# Patient Record
Sex: Female | Born: 1985 | Race: White | Hispanic: No | Marital: Married | Smoking: Never smoker
Health system: Southern US, Community
[De-identification: ages and names within clinical notes are randomized; demographics above are authoritative.]

## PROBLEM LIST (undated history)

## (undated) DIAGNOSIS — N390 Urinary tract infection, site not specified: Secondary | ICD-10-CM

## (undated) DIAGNOSIS — Z789 Other specified health status: Secondary | ICD-10-CM

## (undated) DIAGNOSIS — D6851 Activated protein C resistance: Secondary | ICD-10-CM

## (undated) HISTORY — DX: Other specified health status: Z78.9

## (undated) HISTORY — PX: NO PAST SURGERIES: SHX2092

## (undated) HISTORY — DX: Activated protein C resistance: D68.51

---

## 2011-03-06 LAB — OB RESULTS CONSOLE ANTIBODY SCREEN: Antibody Screen: NEGATIVE

## 2011-03-06 LAB — OB RESULTS CONSOLE ABO/RH

## 2011-03-06 LAB — OB RESULTS CONSOLE HEPATITIS B SURFACE ANTIGEN: Hepatitis B Surface Ag: NEGATIVE

## 2011-03-06 LAB — OB RESULTS CONSOLE GC/CHLAMYDIA: Gonorrhea: NEGATIVE

## 2011-03-06 LAB — OB RESULTS CONSOLE RUBELLA ANTIBODY, IGM: Rubella: IMMUNE

## 2011-09-06 ENCOUNTER — Inpatient Hospital Stay (HOSPITAL_COMMUNITY): Admission: AD | Admit: 2011-09-06 | Payer: Self-pay | Source: Ambulatory Visit | Admitting: Obstetrics and Gynecology

## 2011-09-14 LAB — OB RESULTS CONSOLE GBS: GBS: NEGATIVE

## 2011-10-09 ENCOUNTER — Encounter (HOSPITAL_COMMUNITY): Payer: Self-pay

## 2011-10-09 ENCOUNTER — Inpatient Hospital Stay (HOSPITAL_COMMUNITY)
Admission: AD | Admit: 2011-10-09 | Discharge: 2011-10-12 | DRG: 766 | Disposition: A | Discharge status: Home / Self Care | Payer: 59 | Source: Ambulatory Visit | Attending: Obstetrics and Gynecology | Admitting: Obstetrics and Gynecology

## 2011-10-09 ENCOUNTER — Encounter (HOSPITAL_COMMUNITY): Payer: Self-pay | Admitting: Anesthesiology

## 2011-10-09 ENCOUNTER — Encounter (HOSPITAL_COMMUNITY): Payer: Self-pay | Admitting: *Deleted

## 2011-10-09 DIAGNOSIS — O324XX0 Maternal care for high head at term, not applicable or unspecified: Secondary | ICD-10-CM | POA: Diagnosis present

## 2011-10-09 HISTORY — DX: Urinary tract infection, site not specified: N39.0

## 2011-10-09 LAB — CBC
HCT: 37.2 % (ref 36.0–46.0)
Hemoglobin: 12.5 g/dL (ref 12.0–15.0)
MCHC: 33.6 g/dL (ref 30.0–36.0)
MCV: 93.9 fL (ref 78.0–100.0)
RDW: 13.6 % (ref 11.5–15.5)

## 2011-10-09 MED ORDER — LIDOCAINE HCL (PF) 1 % IJ SOLN
INTRAMUSCULAR | Status: DC | PRN
  Administered 2011-10-09 (×2): 4 mL

## 2011-10-09 MED ORDER — OXYTOCIN 20 UNITS IN LACTATED RINGERS INFUSION - SIMPLE: 125.0000 mL/h | Freq: Once | INTRAVENOUS | Status: DC

## 2011-10-09 MED ORDER — PHENYLEPHRINE 40 MCG/ML (10ML) SYRINGE FOR IV PUSH (FOR BLOOD PRESSURE SUPPORT): 80.0000 ug | PREFILLED_SYRINGE | INTRAVENOUS | Status: DC | PRN

## 2011-10-09 MED ORDER — FLEET ENEMA 7-19 GM/118ML RE ENEM: 1.0000 | ENEMA | RECTAL | Status: DC | PRN

## 2011-10-09 MED ORDER — EPHEDRINE 5 MG/ML INJ
10.0000 mg | INTRAVENOUS | Status: DC | PRN
  Filled 2011-10-09: qty 4

## 2011-10-09 MED ORDER — EPHEDRINE 5 MG/ML INJ: 10.0000 mg | INTRAVENOUS | Status: DC | PRN

## 2011-10-09 MED ORDER — ACETAMINOPHEN 325 MG PO TABS: 650.0000 mg | ORAL_TABLET | ORAL | Status: DC | PRN

## 2011-10-09 MED ORDER — LACTATED RINGERS IV SOLN: 500.0000 mL | Freq: Once | INTRAVENOUS | Status: DC

## 2011-10-09 MED ORDER — DIPHENHYDRAMINE HCL 50 MG/ML IJ SOLN: 12.5000 mg | INTRAMUSCULAR | Status: DC | PRN

## 2011-10-09 MED ORDER — LACTATED RINGERS IV SOLN
INTRAVENOUS | Status: DC
  Administered 2011-10-09 – 2011-10-10 (×3): via INTRAVENOUS

## 2011-10-09 MED ORDER — OXYTOCIN BOLUS FROM INFUSION
500.0000 mL | Freq: Once | INTRAVENOUS | Status: DC
  Filled 2011-10-09: qty 1000
  Filled 2011-10-09: qty 500

## 2011-10-09 MED ORDER — FENTANYL 2.5 MCG/ML BUPIVACAINE 1/10 % EPIDURAL INFUSION (WH - ANES)
INTRAMUSCULAR | Status: DC | PRN
  Administered 2011-10-09: 14 mL/h via EPIDURAL

## 2011-10-09 MED ORDER — LACTATED RINGERS IV SOLN
500.0000 mL | INTRAVENOUS | Status: DC | PRN
  Administered 2011-10-09: 1000 mL via INTRAVENOUS

## 2011-10-09 MED ORDER — ONDANSETRON HCL 4 MG/2ML IJ SOLN: 4.0000 mg | Freq: Four times a day (QID) | INTRAMUSCULAR | Status: DC | PRN

## 2011-10-09 MED ORDER — LIDOCAINE HCL (PF) 1 % IJ SOLN
30.0000 mL | INTRAMUSCULAR | Status: DC | PRN
  Filled 2011-10-09: qty 30

## 2011-10-09 MED ORDER — CITRIC ACID-SODIUM CITRATE 334-500 MG/5ML PO SOLN
30.0000 mL | ORAL | Status: DC | PRN
  Administered 2011-10-10: 30 mL via ORAL
  Filled 2011-10-09: qty 15

## 2011-10-09 MED ORDER — IBUPROFEN 600 MG PO TABS: 600.0000 mg | ORAL_TABLET | Freq: Four times a day (QID) | ORAL | Status: DC | PRN

## 2011-10-09 MED ORDER — OXYCODONE-ACETAMINOPHEN 5-325 MG PO TABS: 1.0000 | ORAL_TABLET | ORAL | Status: DC | PRN

## 2011-10-09 MED ORDER — FENTANYL 2.5 MCG/ML BUPIVACAINE 1/10 % EPIDURAL INFUSION (WH - ANES)
14.0000 mL/h | INTRAMUSCULAR | Status: DC
  Administered 2011-10-09: 14 mL/h via EPIDURAL
  Filled 2011-10-09 (×3): qty 60

## 2011-10-09 MED ORDER — PHENYLEPHRINE 40 MCG/ML (10ML) SYRINGE FOR IV PUSH (FOR BLOOD PRESSURE SUPPORT)
80.0000 ug | PREFILLED_SYRINGE | INTRAVENOUS | Status: DC | PRN
  Filled 2011-10-09: qty 5

## 2011-10-09 NOTE — MAU Note (Signed)
Contractions started yesterday have gotten closer and stronger.  Lost mucous plug yesterday morning.

## 2011-10-09 NOTE — Anesthesia Procedure Notes (Signed)
Epidural Patient location during procedure: OB Start time: 10/09/2011 4:00 PM  Staffing Anesthesiologist: Davarion Cuffee A. Performed by: anesthesiologist   Preanesthetic Checklist Completed: patient identified, site marked, surgical consent, pre-op evaluation, timeout performed, IV checked, risks and benefits discussed and monitors and equipment checked  Epidural Patient position: sitting Prep: site prepped and draped and DuraPrep Patient monitoring: continuous pulse ox and blood pressure Approach: midline Injection technique: LOR air  Needle:  Needle type: Tuohy  Needle gauge: 17 G Needle length: 9 cm Needle insertion depth: 5 cm cm Catheter type: closed end flexible Catheter size: 19 Gauge Catheter at skin depth: 10 cm Test dose: negative and Other  Assessment Events: blood not aspirated, injection not painful, no injection resistance, negative IV test and no paresthesia  Additional Notes Patient identified. Risks and benefits discussed including failed block, incomplete  Pain control, post dural puncture headache, nerve damage, paralysis, blood pressure Changes, nausea, vomiting, reactions to medications-both toxic and allergic and post Partum back pain. All questions were answered. Patient expressed understanding and wished to proceed. Sterile technique was used throughout procedure. Epidural site was Dressed with sterile barrier dressing. No paresthesias, signs of intravascular injection Or signs of intrathecal spread were encountered.  Patient was more comfortable after the epidural was dosed. Please see RN's note for documentation of vital signs and FHR which are stable.

## 2011-10-09 NOTE — MAU Note (Signed)
Patient states she has been having contractions every 5-7. Denies any bleeding or leaking. Reports good fetal movement.

## 2011-10-09 NOTE — H&P (Signed)
Misty Carr is a 26 y.o. female presenting for labor . Maternal Medical History:  Reason for admission: Reason for admission: contractions.  Contractions: Onset was 3-5 hours ago.   Frequency: regular.   Perceived severity is moderate.    Fetal activity: Perceived fetal activity is normal.      OB History    Grav Para Term Preterm Abortions TAB SAB Ect Mult Living   1              Past Medical History  Diagnosis Date  . Urinary tract infection    Past Surgical History  Procedure Date  . No past surgeries    Family History: family history is negative for Anesthesia problems. Social History:  reports that she has never smoked. She has never used smokeless tobacco. She reports that she does not drink alcohol or use illicit drugs.  ROS  Dilation: 5 Effacement (%): 90 Station: -2 Exam by:: jolynn Blood pressure 135/74, pulse 97, temperature 98.7 F (37.1 C), temperature source Oral, resp. rate 18, height 5' 4.5" (1.638 m), weight 196 lb 9.6 oz (89.177 kg), SpO2 100.00%. Maternal Exam:  Uterine Assessment: Contraction strength is moderate.  Contraction frequency is regular.   Abdomen: Patient reports no abdominal tenderness. Fundal height is term FH.   Estimated fetal weight is AGA.   Fetal presentation: vertex  Introitus: Normal vulva. Normal vagina.  Pelvis: adequate for delivery.   Cervix: Cervix evaluated by digital exam.     Physical Exam  Prenatal labs: ABO, Rh:   Antibody:   Rubella:   RPR:    HBsAg:    HIV:    GBS:     Assessment/Plan: Term IUP, labor   [name] M 10/09/2011, 1:44 PM

## 2011-10-09 NOTE — Anesthesia Preprocedure Evaluation (Signed)

## 2011-10-10 ENCOUNTER — Encounter (HOSPITAL_COMMUNITY): Payer: Self-pay | Admitting: Anesthesiology

## 2011-10-10 ENCOUNTER — Inpatient Hospital Stay (HOSPITAL_COMMUNITY): Payer: 59 | Admitting: Anesthesiology

## 2011-10-10 ENCOUNTER — Encounter (HOSPITAL_COMMUNITY): Payer: Self-pay | Admitting: *Deleted

## 2011-10-10 ENCOUNTER — Encounter (HOSPITAL_COMMUNITY)
Admission: AD | Disposition: A | Discharge status: Home / Self Care | Payer: Self-pay | Source: Ambulatory Visit | Attending: Obstetrics and Gynecology

## 2011-10-10 SURGERY — Surgical Case
Anesthesia: Regional

## 2011-10-10 MED ORDER — KETOROLAC TROMETHAMINE 30 MG/ML IJ SOLN
INTRAMUSCULAR | Status: AC
  Filled 2011-10-10: qty 1

## 2011-10-10 MED ORDER — SODIUM CHLORIDE 0.9 % IJ SOLN: 3.0000 mL | INTRAMUSCULAR | Status: DC | PRN

## 2011-10-10 MED ORDER — MEPERIDINE HCL 25 MG/ML IJ SOLN
INTRAMUSCULAR | Status: AC
  Filled 2011-10-10: qty 1

## 2011-10-10 MED ORDER — OXYTOCIN 20 UNITS IN LACTATED RINGERS INFUSION - SIMPLE
1.0000 m[IU]/min | INTRAVENOUS | Status: DC
  Administered 2011-10-10: 1 m[IU]/min via INTRAVENOUS

## 2011-10-10 MED ORDER — KETOROLAC TROMETHAMINE 30 MG/ML IJ SOLN: 30.0000 mg | Freq: Four times a day (QID) | INTRAMUSCULAR | Status: AC | PRN

## 2011-10-10 MED ORDER — ACETAMINOPHEN 325 MG PO TABS: 325.0000 mg | ORAL_TABLET | ORAL | Status: DC | PRN

## 2011-10-10 MED ORDER — MENTHOL 3 MG MT LOZG: 1.0000 | LOZENGE | OROMUCOSAL | Status: DC | PRN

## 2011-10-10 MED ORDER — LACTATED RINGERS IV SOLN
INTRAVENOUS | Status: DC | PRN
  Administered 2011-10-10 (×2): via INTRAVENOUS

## 2011-10-10 MED ORDER — NALBUPHINE HCL 10 MG/ML IJ SOLN
5.0000 mg | INTRAMUSCULAR | Status: DC | PRN
Start: 2011-10-10 — End: 2011-10-12
  Filled 2011-10-10: qty 1

## 2011-10-10 MED ORDER — ONDANSETRON HCL 4 MG/2ML IJ SOLN
INTRAMUSCULAR | Status: DC | PRN
  Administered 2011-10-10: 4 mg via INTRAVENOUS

## 2011-10-10 MED ORDER — SODIUM CHLORIDE 0.9 % IJ SOLN: 3.0000 mL | Freq: Two times a day (BID) | INTRAMUSCULAR | Status: DC

## 2011-10-10 MED ORDER — MEPERIDINE HCL 25 MG/ML IJ SOLN
INTRAMUSCULAR | Status: DC | PRN
  Administered 2011-10-10: 12.5 mg via INTRAVENOUS

## 2011-10-10 MED ORDER — MIDAZOLAM HCL 2 MG/2ML IJ SOLN: 0.5000 mg | Freq: Once | INTRAMUSCULAR | Status: DC | PRN

## 2011-10-10 MED ORDER — DIBUCAINE 1 % RE OINT: 1.0000 "application " | TOPICAL_OINTMENT | RECTAL | Status: DC | PRN

## 2011-10-10 MED ORDER — SODIUM CHLORIDE 0.9 % IV SOLN
250.0000 mL | INTRAVENOUS | Status: DC
  Administered 2011-10-10: 250 mL via INTRAVENOUS

## 2011-10-10 MED ORDER — ZOLPIDEM TARTRATE 5 MG PO TABS: 5.0000 mg | ORAL_TABLET | Freq: Every evening | ORAL | Status: DC | PRN

## 2011-10-10 MED ORDER — DIPHENHYDRAMINE HCL 50 MG/ML IJ SOLN: 12.5000 mg | INTRAMUSCULAR | Status: DC | PRN

## 2011-10-10 MED ORDER — PROMETHAZINE HCL 25 MG/ML IJ SOLN: 6.2500 mg | INTRAMUSCULAR | Status: DC | PRN

## 2011-10-10 MED ORDER — KETOROLAC TROMETHAMINE 30 MG/ML IJ SOLN
30.0000 mg | Freq: Four times a day (QID) | INTRAMUSCULAR | Status: AC | PRN
  Administered 2011-10-10 (×2): 30 mg via INTRAVENOUS
  Filled 2011-10-10: qty 1

## 2011-10-10 MED ORDER — TETANUS-DIPHTH-ACELL PERTUSSIS 5-2.5-18.5 LF-MCG/0.5 IM SUSP: 0.5000 mL | Freq: Once | INTRAMUSCULAR | Status: DC

## 2011-10-10 MED ORDER — SIMETHICONE 80 MG PO CHEW
80.0000 mg | CHEWABLE_TABLET | Freq: Three times a day (TID) | ORAL | Status: DC
  Administered 2011-10-10 – 2011-10-12 (×8): 80 mg via ORAL

## 2011-10-10 MED ORDER — SODIUM BICARBONATE 8.4 % IV SOLN
INTRAVENOUS | Status: DC | PRN
  Administered 2011-10-10: 5 mL via EPIDURAL

## 2011-10-10 MED ORDER — LANOLIN HYDROUS EX OINT: 1.0000 "application " | TOPICAL_OINTMENT | CUTANEOUS | Status: DC | PRN

## 2011-10-10 MED ORDER — BISACODYL 10 MG RE SUPP: 10.0000 mg | Freq: Every day | RECTAL | Status: DC | PRN

## 2011-10-10 MED ORDER — ONDANSETRON HCL 4 MG/2ML IJ SOLN: 4.0000 mg | Freq: Three times a day (TID) | INTRAMUSCULAR | Status: DC | PRN

## 2011-10-10 MED ORDER — MEPERIDINE HCL 25 MG/ML IJ SOLN
6.2500 mg | INTRAMUSCULAR | Status: DC | PRN
  Administered 2011-10-10: 6.25 mg via INTRAVENOUS

## 2011-10-10 MED ORDER — METOCLOPRAMIDE HCL 5 MG/ML IJ SOLN: 10.0000 mg | Freq: Three times a day (TID) | INTRAMUSCULAR | Status: DC | PRN

## 2011-10-10 MED ORDER — FLEET ENEMA 7-19 GM/118ML RE ENEM: 1.0000 | ENEMA | Freq: Every day | RECTAL | Status: DC | PRN

## 2011-10-10 MED ORDER — ONDANSETRON HCL 4 MG PO TABS: 4.0000 mg | ORAL_TABLET | ORAL | Status: DC | PRN

## 2011-10-10 MED ORDER — DIPHENHYDRAMINE HCL 25 MG PO CAPS: 25.0000 mg | ORAL_CAPSULE | ORAL | Status: DC | PRN

## 2011-10-10 MED ORDER — SENNOSIDES-DOCUSATE SODIUM 8.6-50 MG PO TABS
2.0000 | ORAL_TABLET | Freq: Every day | ORAL | Status: DC
  Administered 2011-10-11: 2 via ORAL

## 2011-10-10 MED ORDER — SCOPOLAMINE 1 MG/3DAYS TD PT72
MEDICATED_PATCH | TRANSDERMAL | Status: AC
  Filled 2011-10-10: qty 1

## 2011-10-10 MED ORDER — MORPHINE SULFATE (PF) 0.5 MG/ML IJ SOLN
INTRAMUSCULAR | Status: DC | PRN
Start: 2011-10-10 — End: 2011-10-10
  Administered 2011-10-10: 4 mg via EPIDURAL

## 2011-10-10 MED ORDER — FENTANYL CITRATE 0.05 MG/ML IJ SOLN: 25.0000 ug | INTRAMUSCULAR | Status: DC | PRN

## 2011-10-10 MED ORDER — PRENATAL MULTIVITAMIN CH
1.0000 | ORAL_TABLET | Freq: Every day | ORAL | Status: DC
  Administered 2011-10-11 – 2011-10-12 (×2): 1 via ORAL
  Filled 2011-10-10 (×3): qty 1

## 2011-10-10 MED ORDER — TERBUTALINE SULFATE 1 MG/ML IJ SOLN: 0.2500 mg | Freq: Once | INTRAMUSCULAR | Status: DC | PRN

## 2011-10-10 MED ORDER — NALBUPHINE HCL 10 MG/ML IJ SOLN
5.0000 mg | INTRAMUSCULAR | Status: DC | PRN
  Filled 2011-10-10: qty 1

## 2011-10-10 MED ORDER — MEPERIDINE HCL 25 MG/ML IJ SOLN: 6.2500 mg | INTRAMUSCULAR | Status: DC | PRN

## 2011-10-10 MED ORDER — OXYTOCIN 20 UNITS IN LACTATED RINGERS INFUSION - SIMPLE: 125.0000 mL/h | INTRAVENOUS | Status: AC

## 2011-10-10 MED ORDER — PHENYLEPHRINE HCL 10 MG/ML IJ SOLN
INTRAMUSCULAR | Status: DC | PRN
  Administered 2011-10-10 (×4): 120 ug via INTRAVENOUS

## 2011-10-10 MED ORDER — OXYCODONE-ACETAMINOPHEN 5-325 MG PO TABS
1.0000 | ORAL_TABLET | Freq: Four times a day (QID) | ORAL | Status: DC | PRN
  Administered 2011-10-11: 1 via ORAL
  Filled 2011-10-10: qty 1

## 2011-10-10 MED ORDER — OXYTOCIN 10 UNIT/ML IJ SOLN
20.0000 [IU] | INTRAVENOUS | Status: DC | PRN
  Administered 2011-10-10: 20 [IU] via INTRAVENOUS

## 2011-10-10 MED ORDER — DIPHENHYDRAMINE HCL 50 MG/ML IJ SOLN: 25.0000 mg | INTRAMUSCULAR | Status: DC | PRN

## 2011-10-10 MED ORDER — MORPHINE SULFATE 0.5 MG/ML IJ SOLN
INTRAMUSCULAR | Status: AC
  Filled 2011-10-10: qty 10

## 2011-10-10 MED ORDER — SODIUM CHLORIDE 0.9 % IV SOLN
1.0000 ug/kg/h | INTRAVENOUS | Status: DC | PRN
  Filled 2011-10-10: qty 2.5

## 2011-10-10 MED ORDER — SIMETHICONE 80 MG PO CHEW: 80.0000 mg | CHEWABLE_TABLET | ORAL | Status: DC | PRN

## 2011-10-10 MED ORDER — MEASLES, MUMPS & RUBELLA VAC ~~LOC~~ INJ
0.5000 mL | INJECTION | Freq: Once | SUBCUTANEOUS | Status: DC
  Filled 2011-10-10: qty 0.5

## 2011-10-10 MED ORDER — ACETAMINOPHEN 10 MG/ML IV SOLN
1000.0000 mg | Freq: Four times a day (QID) | INTRAVENOUS | Status: AC | PRN
  Filled 2011-10-10: qty 100

## 2011-10-10 MED ORDER — CEFAZOLIN SODIUM 1-5 GM-% IV SOLN
1.0000 g | Freq: Once | INTRAVENOUS | Status: AC
  Administered 2011-10-10: 1 g via INTRAVENOUS
  Filled 2011-10-10: qty 50

## 2011-10-10 MED ORDER — DIPHENHYDRAMINE HCL 25 MG PO CAPS: 25.0000 mg | ORAL_CAPSULE | Freq: Four times a day (QID) | ORAL | Status: DC | PRN

## 2011-10-10 MED ORDER — IBUPROFEN 800 MG PO TABS
800.0000 mg | ORAL_TABLET | Freq: Three times a day (TID) | ORAL | Status: DC | PRN
  Administered 2011-10-10 – 2011-10-12 (×4): 800 mg via ORAL
  Filled 2011-10-10 (×5): qty 1

## 2011-10-10 MED ORDER — WITCH HAZEL-GLYCERIN EX PADS: 1.0000 "application " | MEDICATED_PAD | CUTANEOUS | Status: DC | PRN

## 2011-10-10 MED ORDER — SCOPOLAMINE 1 MG/3DAYS TD PT72
1.0000 | MEDICATED_PATCH | Freq: Once | TRANSDERMAL | Status: DC
  Administered 2011-10-10: 1.5 mg via TRANSDERMAL

## 2011-10-10 MED ORDER — ONDANSETRON HCL 4 MG/2ML IJ SOLN: 4.0000 mg | INTRAMUSCULAR | Status: DC | PRN

## 2011-10-10 MED ORDER — NALOXONE HCL 0.4 MG/ML IJ SOLN: 0.4000 mg | INTRAMUSCULAR | Status: DC | PRN

## 2011-10-10 SURGICAL SUPPLY — 23 items
CLOTH BEACON ORANGE TIMEOUT ST (SAFETY) ×2 IMPLANT
DRESSING TELFA 8X3 (GAUZE/BANDAGES/DRESSINGS) IMPLANT
ELECT REM PT RETURN 9FT ADLT (ELECTROSURGICAL) ×2
ELECTRODE REM PT RTRN 9FT ADLT (ELECTROSURGICAL) ×1 IMPLANT
EXTRACTOR VACUUM M CUP 4 TUBE (SUCTIONS) IMPLANT
GAUZE SPONGE 4X4 12PLY STRL LF (GAUZE/BANDAGES/DRESSINGS) ×4 IMPLANT
GLOVE BIO SURGEON STRL SZ7 (GLOVE) ×4 IMPLANT
GOWN PREVENTION PLUS LG XLONG (DISPOSABLE) ×6 IMPLANT
KIT ABG SYR 3ML LUER SLIP (SYRINGE) IMPLANT
NEEDLE HYPO 25X5/8 SAFETYGLIDE (NEEDLE) ×2 IMPLANT
NS IRRIG 1000ML POUR BTL (IV SOLUTION) ×2 IMPLANT
PACK C SECTION WH (CUSTOM PROCEDURE TRAY) ×2 IMPLANT
PAD ABD 7.5X8 STRL (GAUZE/BANDAGES/DRESSINGS) IMPLANT
SLEEVE SCD COMPRESS KNEE MED (MISCELLANEOUS) IMPLANT
STRIP CLOSURE SKIN 1/2X4 (GAUZE/BANDAGES/DRESSINGS) ×2 IMPLANT
SUT CHROMIC 0 CTX 36 (SUTURE) ×6 IMPLANT
SUT MON AB 4-0 PS1 27 (SUTURE) ×2 IMPLANT
SUT PDS AB 0 CT1 27 (SUTURE) ×4 IMPLANT
SUT VIC AB 3-0 CT1 27 (SUTURE) ×2
SUT VIC AB 3-0 CT1 TAPERPNT 27 (SUTURE) ×2 IMPLANT
TOWEL OR 17X24 6PK STRL BLUE (TOWEL DISPOSABLE) ×4 IMPLANT
TRAY FOLEY CATH 14FR (SET/KITS/TRAYS/PACK) ×2 IMPLANT
WATER STERILE IRR 1000ML POUR (IV SOLUTION) ×2 IMPLANT

## 2011-10-10 NOTE — Progress Notes (Signed)
Subjective: Postpartum Day 0  Cesarean Delivery Patient reports incisional pain and tolerating PO.    Objective: Vital signs in last 24 hours: Temp:  [97.6 F (36.4 C)-98.8 F (37.1 C)] 98 F (36.7 C) (06/04 0810) Pulse Rate:  [70-104] 80  (06/04 0810) Resp:  [16-27] 20  (06/04 0810) BP: (72-135)/(43-81) 96/55 mmHg (06/04 0810) SpO2:  [93 %-100 %] 97 % (06/04 0810) Weight:  [89.177 kg (196 lb 9.6 oz)] 89.177 kg (196 lb 9.6 oz) (06/03 1202)  Physical Exam:  General: alert, cooperative and appears stated age Lochia: appropriate Uterine Fundus: firm Incision: healing well DVT Evaluation: No evidence of DVT seen on physical exam.   Basename 10/09/11 1345  HGB 12.5  HCT 37.2    Assessment/Plan: Status post Cesarean section. Doing well postoperatively.  Continue current care.  Loreli Debruler L 10/10/2011, 8:25 AM

## 2011-10-10 NOTE — Progress Notes (Signed)
Pt C/C pushing over 2 hrs , now straight OA, ctx only q 3-5 mi with reactive FHR She still making good pushing effort Will aug with pitocin and follow

## 2011-10-10 NOTE — Addendum Note (Signed)
Addendum  created 10/10/11 1552 by Algis Greenhouse, CRNA   Modules edited:Notes Section

## 2011-10-10 NOTE — Transfer of Care (Signed)
Immediate Anesthesia Transfer of Care Note  Patient: Misty Carr  Procedure(s) Performed: Procedure(s) (LRB): CESAREAN SECTION (N/A)  Patient Location: PACU  Anesthesia Type: Epidural  Level of Consciousness: awake, alert  and oriented  Airway & Oxygen Therapy: Patient Spontanous Breathing  Post-op Assessment: Report given to PACU RN and Post -op Vital signs reviewed and stable  Post vital signs: Reviewed and stable  Complications: No apparent anesthesia complications

## 2011-10-10 NOTE — Anesthesia Postprocedure Evaluation (Signed)
  Anesthesia Post Note  Patient: Misty Carr  Procedure(s) Performed: Procedure(s) (LRB): CESAREAN SECTION (N/A)  Anesthesia type: Epidural  Patient location: Mother/Baby  Post pain: Pain level controlled  Post assessment: Post-op Vital signs reviewed  Last Vitals:  Filed Vitals:   10/10/11 1330  BP: 103/67  Pulse: 98  Temp: 36.5 C  Resp: 20    Post vital signs: Reviewed  Level of consciousness:alert  Complications: No apparent anesthesia complications

## 2011-10-10 NOTE — Op Note (Signed)
Preoperative diagnosis: Failure to descend  Postoperative diagnosis: Same  Procedure: Primary low transverse cesarean section  Anesthesia: Epidural  Surgeon: Marcelle Overlie  EBL: None 900 cc  Specimens removed: Placenta to labor and delivery  Procedure and findings:  The patient was taken to the operating room after an adequate level of epidural anesthesia was obtained with the patient supine position the abdomen prepped and draped in the usual fashion for cesarean section. Foley catheter was already M. draining clear urine appropriate timeout was taken at that point.  Transverse Pfannenstiel incision was made 2 finger restaurant symphysis carried down to the fascia which was incised and extended transversely. Rectus muscle divided in the midline peritoneum entered superiorly without incident and extended in a vertical fashion. The vesicouterine serosa was incised and the bladder was bluntly and sharply dissected below, bladder blade repositioned. Transverse incision made in the lower segment extended with blunt dissection the patient and then delivered of a healthy female Apgars 9 and 10 the infant was suctioned cord clamped and passed the pediatric team for further care. Placenta was then delivered manually intact and sent to labor and delivery. Uterus exteriorized cavity wiped clean with a laparotomy pack closure transversely of 0 chromic in a locked fashion followed by an imbricating layer of 0 chromic bilateral tubes and ovaries were normal careful inspection the bladder flap area it revealed it to be hemostatic and intact. Prior to closure sponge denies precast reported as correct x2 peritoneum was closed with a running 2-0 Vicryl suture. 2-0 Vicryl running suture used to reapproximate the rectus muscles in the midline. A 0 PDS suture was then used to close the fascia from laterally to midline on either side subcutaneous tissue was irrigated noted to be minimal to moderate and hemostatic 4-0 Monocryl  subcuticular closure. Clear urine noted at the end of the case she did receive Ancef 1 g IV preop and Pitocin IV after the cord was clamped mother and baby doing well that point.  Dictated with [facility]  Barret Esquivel M. Milana Obey.D.

## 2011-10-10 NOTE — Anesthesia Postprocedure Evaluation (Signed)
Anesthesia Post Note  Patient: Misty Carr  Procedure(s) Performed: Procedure(s) (LRB): CESAREAN SECTION (N/A)  Anesthesia type: Epidural  Patient location: PACU  Post pain: Pain level controlled  Post assessment: Post-op Vital signs reviewed  Last Vitals:  Filed Vitals:   10/10/11 0345  BP: 84/50  Pulse: 94  Temp:   Resp: 23    Post vital signs: Reviewed  Level of consciousness: awake  Complications: No apparent anesthesia complications

## 2011-10-10 NOTE — Progress Notes (Signed)
Stable +reactive FHR but no further descent, rec CS for failure to descend, proced + risks discussed

## 2011-10-11 ENCOUNTER — Encounter (HOSPITAL_COMMUNITY): Payer: Self-pay | Admitting: Obstetrics and Gynecology

## 2011-10-11 LAB — CBC
HCT: 23.7 % — ABNORMAL LOW (ref 36.0–46.0)
Hemoglobin: 7.8 g/dL — ABNORMAL LOW (ref 12.0–15.0)
MCV: 94 fL (ref 78.0–100.0)
Platelets: 134 10*3/uL — ABNORMAL LOW (ref 150–400)
RBC: 2.52 MIL/uL — ABNORMAL LOW (ref 3.87–5.11)
WBC: 14.7 10*3/uL — ABNORMAL HIGH (ref 4.0–10.5)

## 2011-10-11 NOTE — Anesthesia Postprocedure Evaluation (Signed)
  Anesthesia Post-op Note  Patient: Misty Carr  Procedure(s) Performed: Procedure(s) (LRB): CESAREAN SECTION (N/A)  Patient Location: Mother/Baby  Anesthesia Type: Epidural  Level of Consciousness: awake, alert  and oriented  Airway and Oxygen Therapy: Patient Spontanous Breathing  Post-op Pain: mild  Post-op Assessment: Patient's Cardiovascular Status Stable and Respiratory Function Stable  Post-op Vital Signs: stable  Complications: No apparent anesthesia complications

## 2011-10-11 NOTE — Progress Notes (Signed)
Subjective: Postpartum Day 1: Cesarean Delivery Patient reports tolerating PO and no problems voiding.    Objective: Vital signs in last 24 hours: Temp:  [97.4 F (36.3 C)-98.4 F (36.9 C)] 97.9 F (36.6 C) (06/05 0547) Pulse Rate:  [76-103] 76  (06/05 0547) Resp:  [18-22] 18  (06/05 0547) BP: (91-103)/(51-67) 94/60 mmHg (06/05 0547) SpO2:  [97 %-98 %] 98 % (06/04 2345)  Physical Exam:  General: alert and cooperative Lochia: appropriate Uterine Fundus: firm Incision: healing well, no significant drainage DVT Evaluation: No evidence of DVT seen on physical exam.   Basename 10/11/11 0520 10/09/11 1345  HGB 7.8* 12.5  HCT 23.7* 37.2    Assessment/Plan: Status post Cesarean section. Doing well postoperatively.  Continue current care.  Misty Carr 10/11/2011, 9:20 AM

## 2011-10-11 NOTE — Addendum Note (Signed)
Addendum  created 10/11/11 1119 by Earmon Phoenix, CRNA   Modules edited:Charges VN, Notes Section

## 2011-10-11 NOTE — Plan of Care (Signed)
Problem: Discharge Progression Outcomes Goal: Remove staples per MD order Outcome: Not Applicable Date Met:  10/11/11 Internal sutures on incision

## 2011-10-12 MED ORDER — OXYCODONE-ACETAMINOPHEN 5-500 MG PO CAPS: 1.0000 | ORAL_CAPSULE | ORAL | Status: AC | PRN

## 2011-10-12 NOTE — Progress Notes (Signed)
Subjective: Postpartum Day two: Cesarean Delivery Patient reports tolerating PO, + flatus and no problems voiding.    Objective: Vital signs in last 24 hours: Temp:  [97.8 F (36.6 C)-98.4 F (36.9 C)] 98.4 F (36.9 C) (06/06 0522) Pulse Rate:  [88-102] 90  (06/06 0522) Resp:  [18-19] 18  (06/06 0522) BP: (97-109)/(60-67) 97/60 mmHg (06/06 0522)  Physical Exam:  General: alert Lochia: appropriate Uterine Fundus: firm Incision: healing well DVT Evaluation: No evidence of DVT seen on physical exam.   Basename 10/11/11 0520 10/09/11 1345  HGB 7.8* 12.5  HCT 23.7* 37.2    Assessment/Plan: Status post Cesarean section. Doing well postoperatively.  Discharge home with standard precautions and return to clinic in 4-6 weeks.  Ahmarion Saraceno S 10/12/2011, 10:16 AM

## 2011-10-12 NOTE — Discharge Summary (Signed)
Obstetric Discharge Summary Reason for Admission: onset of labor Prenatal Procedures: none Intrapartum Procedures: cesarean: low cervical, transverse  For failure to descend Postpartum Procedures: none Complications-Operative and Postpartum: none Hemoglobin  Date Value Range Status  10/11/2011 7.8* 12.0-15.0 (g/dL) Final     DELTA CHECK NOTED     REPEATED TO VERIFY     HCT  Date Value Range Status  10/11/2011 23.7* 36.0-46.0 (%) Final    Physical Exam:  General: alert Lochia: appropriate Uterine Fundus: firm Incision: healing well DVT Evaluation: No evidence of DVT seen on physical exam.  Discharge Diagnoses: Term Pregnancy-delivered  Discharge Information: Date: 10/12/2011 Activity: pelvic rest Diet: routine Medications: Percocet Condition: stable Instructions: refer to practice specific booklet Discharge to: home   Newborn Data: Live born female  Birth Weight: 8 lb 6 oz (3800 g) APGAR: 9, 9  Home with mother.  Misty Carr S 10/12/2011, 10:17 AM

## 2014-03-09 ENCOUNTER — Encounter (HOSPITAL_COMMUNITY): Payer: Self-pay | Admitting: Obstetrics and Gynecology

## 2018-06-07 ENCOUNTER — Encounter: Payer: Self-pay | Admitting: Obstetrics and Gynecology

## 2018-06-07 ENCOUNTER — Ambulatory Visit (INDEPENDENT_AMBULATORY_CARE_PROVIDER_SITE_OTHER): Payer: BLUE CROSS/BLUE SHIELD | Admitting: Obstetrics and Gynecology

## 2018-06-07 VITALS — BP 110/72 | HR 106 | Ht 64.0 in | Wt 192.0 lb

## 2018-06-07 DIAGNOSIS — Z1329 Encounter for screening for other suspected endocrine disorder: Secondary | ICD-10-CM | POA: Diagnosis not present

## 2018-06-07 DIAGNOSIS — Z23 Encounter for immunization: Secondary | ICD-10-CM | POA: Diagnosis not present

## 2018-06-07 DIAGNOSIS — Z131 Encounter for screening for diabetes mellitus: Secondary | ICD-10-CM

## 2018-06-07 DIAGNOSIS — N96 Recurrent pregnancy loss: Secondary | ICD-10-CM

## 2018-06-07 NOTE — Progress Notes (Signed)
Obstetrics & Gynecology Office Visit   Chief Complaint:  Chief Complaint  Patient presents with  . 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 [id number] presenting today accompanied by her husband to discuss options for recurrent pregnancy loss work up.  The couple has two healthy children, however subsequently had two miscarriage early second trimester around 32 weeks based on ultrasound measurements.  Other than cesarean section prior pregnancies uncomplicated (previa G2).    The patient and her husband have no contributing past medical history.  Her sister does have a bicornuate uterus.    Review of Systems: Review of Systems  Constitutional: Negative.   Gastrointestinal: Negative.   Genitourinary: Negative.      Past Medical History:  Past Medical History:  Diagnosis Date  . No known health problems   . Urinary tract infection     Past Surgical History:  Past Surgical History:  Procedure Laterality Date  . CESAREAN SECTION  10/10/2011   Procedure: CESAREAN SECTION;  Surgeon: Meriel Picaichard M Holland, MD;  Location: [facility];  Service: Gynecology;  Laterality: N/A;  . CESAREAN SECTION  04/10/2013   Placental previa  . NO PAST SURGERIES      Gynecologic History: Patient's last menstrual period was 05/31/2018 (exact date).  Obstetric History: [id number]  Family History:  Family History  Problem Relation Age of Onset  . Colon cancer Maternal Grandmother [id number]  . Anesthesia problems Neg Hx     Social History:  Social History   Socioeconomic History  . Marital status: Married    Spouse name: Not on file  . Number of children: Not on file  . Years of education: Not on file  . Highest education level: Not on file  Occupational History  . Not on file  Social Needs  . Financial resource strain: Not on file  . Food insecurity:    Worry: Not on file    Inability: Not on file  . Transportation needs:    Medical: Not on file   Non-medical: Not on file  Tobacco Use  . Smoking status: Never Smoker  . Smokeless tobacco: Never Used  Substance and Sexual Activity  . Alcohol use: No  . Drug use: No  . Sexual activity: Yes    Birth control/protection: None  Lifestyle  . Physical activity:    Days per week: Not on file    Minutes per session: Not on file  . Stress: Not on file  Relationships  . Social connections:    Talks on phone: Not on file    Gets together: Not on file    Attends religious service: Not on file    Active member of club or organization: Not on file    Attends meetings of clubs or organizations: Not on file    Relationship status: Not on file  . Intimate partner violence:    Fear of current or ex partner: Not on file    Emotionally abused: Not on file    Physically abused: Not on file    Forced sexual activity: Not on file  Other Topics Concern  . Not on file  Social History Narrative  . Not on file    Allergies:  No Known Allergies  Medications: Prior to Admission medications   Not on File    Physical Exam Vitals:  Vitals:   06/07/18 1046  BP: 110/72  Pulse: (!) 106   Patient's last menstrual period was  05/31/2018 (exact date).  General: NAD HEENT: normocephalic, anicteric Pulmonary: No increased work of breathing Neurologic: Grossly intact Psychiatric: mood appropriate, affect full  Female chaperone present for pelvic  portions of the physical exam  Assessment: 33 y.o. [phone] presenting with two concurrent miscarriage in the early 2nd trimester  Plan: Problem List Items Addressed This Visit    None    Visit Diagnoses    Flu vaccine need    -  Primary   Relevant Orders   Flu Vaccine QUAD 36+ mos IM (Completed)   Recurrent pregnancy loss       Relevant Orders   Antithrombin III   Cardiolipin antibodies, IgG, IgM, IgA   Factor V Leiden   Lupus Anticoagulant Panel   TSH   Hemoglobin A1c   [place] Sonohysterogram   Diabetes mellitus screening       Relevant  Orders   Hemoglobin A1c   Thyroid disorder screening       Relevant Orders   TSH     1) Recurrent pregnancy loss -  Definition of recurrent miscarriage, defined as 3 or more successive first trimester losses discussed in detail.  The most commonly identifiable etiology is antiphospholipid antibody syndrome (APS).  While APS is the only thrombophilia with a clearly proven association for first trimester miscarriage, other hypercoagulable disorders while showing a weak or inconsistent relationship to first trimester miscarriage are often times still tested for,  These include Factor V Leiden, MTHFR (homozygous), prothrombin gene mutation, Protein C and S deficiency.  Uterine structural lesions, endocrine factors (thyoid disease, diabetes, and PCOS), and disorders in parental karyotype has also been implicated.  Use of daily Asprin has been evaluated in the setting of recurrent pregnancy loss and shows no clear benefit in patient without antiphospholipid antibody syndrome.  Approximately 50% of couple with recurrent miscarriage will not have a readily identifiable etiology  Several studies have examined and established a role for emotional support and stress reduction in improving pregnancy outcomes for patient with recurrent miscarriage. The tender love and care model utilizes weekly to twice weekly follow up, more frequent ultrasound, and has consistently demonstrated improved live birth rates in several studies.  The risk of miscarriage following documentation of fetal heart tones drops significantly to approximately 3%. - Basic laboratory work up started today - SIS ultrasound to evaluate for compounding mullerian abnormalities although none mention on prior pregnancy ultrasound or at the time of Cesarean sections.  2) At present there is no evidence to support the use of vaginal progesterone in women with a history of unexplained recurrent first trimester miscarriage in an attempt to minimize risk of  miscarriage in a subsequent pregnancy.  Its use did not show a clinically significant increase in live birth rate.  However, there were also no identified adverse events to its use.  This data was shared with the patient.    "A Randomized Trial of Progesterone in Women with Recurrent Miscarriages" NEJM 373;22 April 02, 2014  3) PRISM trial was a multicenter, double-blinded, randomized control trial conducted in the [place] looking at the effects of progesterone supplementation in women with threatened miscarriage. Women were adminstered 400mg  of vaginal progesterone twice daily from onset of bleeding through [redacted] weeks gestation.  The trial included 4038 women.  There was no significant improvement in livebirth rates demonstrated in the overall cohort (delivery after 34 weeks).  However in women with a history of one prior miscarriage progesterone group 76% resulted in liveborn pregnancy vs 72% for placebo (RR  1.05 95% CI 1.00 to 1.12).  For women with three or more first trimester miscarriages the effect was more pronounced with 72% liveborn pregnancy rate vs 57% for placebo (RR 1.28 95% CI 1.08 to 1.151).  "A Randomized Trial of Progesterone in Women with Bleeding in Early Pregnancy" Beryle Flock, MD et al NEJM 2019, [phone]  4) A total of 30 minutes were spent in face-to-face contact with the patient during this encounter with over half of that time devoted to counseling and coordination of care.   Vena Austria, MD, [facility], [facility]

## 2018-06-12 ENCOUNTER — Other Ambulatory Visit: Payer: Self-pay | Admitting: Obstetrics and Gynecology

## 2018-06-12 DIAGNOSIS — N96 Recurrent pregnancy loss: Secondary | ICD-10-CM

## 2018-06-14 LAB — LUPUS ANTICOAGULANT PANEL
APTT: 27.1 s
Anticardiolipin Ab, IgG: <10 [GPL'U]
Anticardiolipin Ab, IgM: <10 [MPL'U]
Beta-2 Glycoprotein I, IgA: 18 SAU
Beta-2 Glycoprotein I, IgM: <10 SMU
DRVVT Screen Seconds: 37.5 s
HEXAGONAL PHOSPHOLIPID NEUTRAL: 0 s
INR: 0.9 ratio
Platelet Neutralization: 0 s
Prothrombin Time: 10.2 s
THROMBIN TIME: 16.1 s

## 2018-06-14 LAB — HEMOGLOBIN A1C
ESTIMATED AVERAGE GLUCOSE: 108 mg/dL
HEMOGLOBIN A1C: 5.4 % (ref 4.8–5.6)

## 2018-06-14 LAB — FACTOR V LEIDEN

## 2018-06-14 LAB — ANTITHROMBIN III: AntiThromb III Func: 107 % (ref 75–135)

## 2018-06-14 LAB — CARDIOLIPIN ANTIBODIES, IGG, IGM, IGA: Anticardiolipin IgM: <9 MPL U/mL (ref 0–12)

## 2018-06-14 LAB — TSH: TSH: 1.6 u[IU]/mL (ref 0.450–4.500)

## 2018-06-18 ENCOUNTER — Ambulatory Visit (INDEPENDENT_AMBULATORY_CARE_PROVIDER_SITE_OTHER): Payer: BLUE CROSS/BLUE SHIELD | Admitting: Obstetrics and Gynecology

## 2018-06-18 ENCOUNTER — Ambulatory Visit (INDEPENDENT_AMBULATORY_CARE_PROVIDER_SITE_OTHER): Payer: BLUE CROSS/BLUE SHIELD

## 2018-06-18 ENCOUNTER — Encounter: Payer: Self-pay | Admitting: Obstetrics and Gynecology

## 2018-06-18 VITALS — BP 102/68 | Wt 187.0 lb

## 2018-06-18 DIAGNOSIS — Z3202 Encounter for pregnancy test, result negative: Secondary | ICD-10-CM

## 2018-06-18 DIAGNOSIS — N8302 Follicular cyst of left ovary: Secondary | ICD-10-CM | POA: Diagnosis not present

## 2018-06-18 DIAGNOSIS — D6851 Activated protein C resistance: Secondary | ICD-10-CM | POA: Diagnosis not present

## 2018-06-18 DIAGNOSIS — N96 Recurrent pregnancy loss: Secondary | ICD-10-CM | POA: Diagnosis not present

## 2018-06-18 LAB — POCT URINE PREGNANCY: Preg Test, Ur: NEGATIVE

## 2018-06-18 NOTE — Progress Notes (Signed)
Gynecology Ultrasound Follow Up  Chief Complaint:  Chief Complaint  Patient presents with  . Follow-up    SIS     History of Present Illness: Patient is a 33 y.o. female who presents today for ultrasound evaluation of recurrent early second trimester pregnancy loss .  Ultrasound demonstrates the following findgins Adnexa: normal  Uterus: Non-enlarged, normal uterine cavity on SIS with endometrial stripe devoid of focal filling defects Additional: no free fluid  Review of Systems: Review of Systems  Constitutional: Negative.   Gastrointestinal: Negative.   Genitourinary: Negative.     Past Medical History:  Past Medical History:  Diagnosis Date  . No known health problems   . Urinary tract infection     Past Surgical History:  Past Surgical History:  Procedure Laterality Date  . CESAREAN SECTION  10/10/2011   Procedure: CESAREAN SECTION;  Surgeon: Meriel Pica, MD;  Location: [facility];  Service: Gynecology;  Laterality: N/A;  . CESAREAN SECTION  04/10/2013   Placental previa  . NO PAST SURGERIES      Gynecologic History:  Patient's last menstrual period was 05/31/2018 (exact date).   Family History:  Family History  Problem Relation Age of Onset  . Colon cancer Maternal Grandmother 55  . Anesthesia problems Neg Hx     Social History:  Social History   Socioeconomic History  . Marital status: Married    Spouse name: Not on file  . Number of children: Not on file  . Years of education: Not on file  . Highest education level: Not on file  Occupational History  . Not on file  Social Needs  . Financial resource strain: Not on file  . Food insecurity:    Worry: Not on file    Inability: Not on file  . Transportation needs:    Medical: Not on file    Non-medical: Not on file  Tobacco Use  . Smoking status: Never Smoker  . Smokeless tobacco: Never Used  Substance and Sexual Activity  . Alcohol use: No  . Drug use: No  . Sexual activity: Yes   Birth control/protection: None  Lifestyle  . Physical activity:    Days per week: Not on file    Minutes per session: Not on file  . Stress: Not on file  Relationships  . Social connections:    Talks on phone: Not on file    Gets together: Not on file    Attends religious service: Not on file    Active member of club or organization: Not on file    Attends meetings of clubs or organizations: Not on file    Relationship status: Not on file  . Intimate partner violence:    Fear of current or ex partner: Not on file    Emotionally abused: Not on file    Physically abused: Not on file    Forced sexual activity: Not on file  Other Topics Concern  . Not on file  Social History Narrative  . Not on file    Allergies:  No Known Allergies  Medications: Prior to Admission medications   Not on File    Physical Exam Vitals: Blood pressure 102/68, weight 187 lb (84.8 kg), last menstrual period 05/31/2018, unknown if currently breastfeeding.  General: NAD HEENT: normocephalic, anicteric Pulmonary: No increased work of breathing Extremities: no edema, erythema, or tenderness Neurologic: Grossly intact, normal gait Psychiatric: mood appropriate, affect full . [place] Sonohysterogram  Result Date: 06/19/2018 Patient Name: Misty Carr DOB: 05-Nov-1985 MRN: [id number] ULTRASOUND REPORT Location: [facility] Date of Service: 06/18/2018 Indications: Recurrent pregnancy loss Findings: The uterus is anteverted cervix/retroflexed uterus and measures 7.8 x 5.2 x 4.1cm. Echo texture is homogenous without evidence of focal masses. The Endometrium measures 7.95mm. Right Ovary measures 2.5 x 2.9 x 1.4cm. It is normal in appearance. Left Ovary measures 2.8 x 2.5 x 2.0cm. Dominant follicle seen. Survey of the adnexa demonstrates no adnexal masses. There is no free fluid in the cul de sac. Sonohysterogram performed. Consent form signed. 10 ml of saline injected. No filling defect noted. Impression: 1.  Normal gyn ultrasound. 2. Normal sonohysterogram. Recommendations: 1.Clinical correlation with the patient's History and Physical Exam. Darlina Guys, RDMS RVT Images reviewed.  Normal GYN study without visualized pathology.  No filling defects and normal cavity contour on SIS.  Vena Austria, MD, FACOG [facility], [facility]    Assessment: 33 y.o. [phone] with recurrent early second trimester pregnancy loss  Plan: Problem List Items Addressed This Visit      Hematopoietic and Hemostatic   Heterozygous factor V Leiden mutation (HCC)    Other Visit Diagnoses    Recurrent pregnancy loss    -  Primary   Relevant Orders   POCT urine pregnancy (Completed)      Early second trimester pregnancy loss x 2 1) SIS today does not demonstrate any uterine structural abnormalities that may account for the patient losses  2) Factor V Leiden Heterozygote - the patient hypercoagulable work did reveal factor V Leiden mutation,  We discussed that the carrier rate is approximately 5% in caucasian population, and that it confers about a 5 fold increase risk of DVT/PE.  This has implication for states/procedure which carrier increased VTE risk such as surgery or pregnancy.   Factor V Leiden heterozygous patient account for approximately 40% of case of VTE in pregnancy.    "Although the risk of VTE among pregnant women who are heterozygous for factor V Leiden without a personal history of VTE or an affected first-degree relative with a thrombotic episode before age 64 years is increased above the baseline pregnancy risk, it is estimated to be no more than 5-12/1,000 deliveries. In contrast, this risk increases to up to 10% among pregnant women heterozygous for the factor V Leiden mutation with a personal history of VTE. A woman who is heterozygous for factor V Leiden with only an affected first-degree relative but with no personal history of VTE only has a slightly higher risk of VTE during  pregnancy (15/1,000 deliveries) than that conferred by her thrombophilia alone (20, 21). Pregnant women who are homozygous for factor V Leiden without a personal history of VTE or an affected first-degree relative have a 1-2% risk for VTE, whereas those with such a history have a 17% risk"   "[facility] [employer] [facility] tested low-risk women with a singleton pregnancy less than 14 weeks of gestation and found no increase in the incidence of fetal loss among women heterozygous for factor V Leiden "  "Inherited Thrombophilias in Pregnancy" ACOG Practice Bulletin 197 October 30 2016  While there is insufficient evidence at present to recommend routine prophylaxis with LMWH we discussed this in the first 20 weeks of pregnancy vs low dose asprin.  3) A total of 15 minutes were spent in face-to-face contact with the patient during this encounter with over half of that time devoted to  counseling and coordination of care.  4) Return if symptoms worsen or fail to improve.     Vena AustriaAndreas Brett Soza, MD, Merlinda FrederickFACOG [facility], [facility]

## 2018-06-18 NOTE — Patient Instructions (Signed)
Factor V Leiden Test Why am I having this test? The factor V Leiden test is done to determine whether you have a gene mutation that increases your risk of developing blood clots. This gene mutation is called factor V Leiden thrombophilia. It is passed down through families (inherited). Your health care provider may perform this test if you:  Experienced an unexplained clotting event.  Have a family history of blood clots.  Had a blood clot before the age of 91.  Had a blood clot during pregnancy or while taking birth control pills.  Had a blood clot in an unusual vein location.  Had a blood clot in an artery. What is being tested? This test checks for the factor V Leiden genetic mutation through DNA analysis of the factor V gene. Factor V is an important protein involved in normal blood clotting. The abnormal form of factor V (factor V Leiden) can increase the risk of blood clotting. What kind of sample is taken?  A blood sample is required for this test. It is usually collected by inserting a needle into a blood vessel. Tell a health care provider about:  All medicines you are taking, including vitamins, herbs, eye drops, creams, and over-the-counter medicines.  Any medical conditions you have.  Any blood disorders you have. How are the results reported? Your test results will be reported as either positive or negative for the factor V Leiden genetic mutation. If the results are positive, more specific information about the genetic mutation may also be included. What do the results mean? A negative test result means that no genetic defect was found. A positive test result indicates the presence of the factor V Leiden genetic mutation. People receive a factor V gene from each of their parents, so the mutation may be either of the following:  Heterozygous. This means that only one of the two factor V genes has a mutation. A person with this result has a slightly increased risk for  clotting.  Homozygous. This means that both of the factor V genes have a mutation. A person with this result has a much higher risk of clotting. Talk with your health care provider about what your results mean. Questions to ask your health care provider Ask your health care provider, or the department that is doing the test:  When will my results be ready?  How will I get my results?  What are my treatment options?  What other tests do I need?  What are my next steps? Summary  The factor V Leiden test is done to determine whether you have an inherited gene mutation that increases your risk of developing blood clots.  A positive test result indicates the presence of the factor V Leiden genetic mutation.  Talk with your health care provider about what your results may mean. This information is not intended to replace advice given to you by your health care provider. Make sure you discuss any questions you have with your health care provider. Document Released: 05/27/2004 Document Revised: 12/13/2016 Document Reviewed: 12/13/2016 Elsevier Interactive Patient Education  2019 [employer].

## 2018-06-19 ENCOUNTER — Other Ambulatory Visit: Payer: Self-pay | Admitting: Obstetrics and Gynecology

## 2018-06-19 DIAGNOSIS — D6851 Activated protein C resistance: Secondary | ICD-10-CM | POA: Insufficient documentation

## 2018-09-03 ENCOUNTER — Telehealth: Payer: Self-pay | Admitting: Obstetrics and Gynecology

## 2018-09-25 DIAGNOSIS — F411 Generalized anxiety disorder: Secondary | ICD-10-CM | POA: Diagnosis not present

## 2018-10-07 DIAGNOSIS — F411 Generalized anxiety disorder: Secondary | ICD-10-CM | POA: Diagnosis not present

## 2018-10-21 DIAGNOSIS — F411 Generalized anxiety disorder: Secondary | ICD-10-CM | POA: Diagnosis not present

## 2018-11-04 DIAGNOSIS — F411 Generalized anxiety disorder: Secondary | ICD-10-CM | POA: Diagnosis not present

## 2018-11-18 DIAGNOSIS — F411 Generalized anxiety disorder: Secondary | ICD-10-CM | POA: Diagnosis not present

## 2018-12-02 DIAGNOSIS — F411 Generalized anxiety disorder: Secondary | ICD-10-CM | POA: Diagnosis not present

## 2018-12-16 DIAGNOSIS — F411 Generalized anxiety disorder: Secondary | ICD-10-CM | POA: Diagnosis not present

## 2018-12-30 DIAGNOSIS — F411 Generalized anxiety disorder: Secondary | ICD-10-CM | POA: Diagnosis not present

## 2019-01-02 ENCOUNTER — Ambulatory Visit (INDEPENDENT_AMBULATORY_CARE_PROVIDER_SITE_OTHER): Payer: BC Managed Care – PPO | Admitting: Advanced Practice Midwife

## 2019-01-02 ENCOUNTER — Other Ambulatory Visit (HOSPITAL_COMMUNITY)
Admission: RE | Admit: 2019-01-02 | Discharge: 2019-01-02 | Disposition: A | Discharge status: Home / Self Care | Payer: BC Managed Care – PPO | Source: Ambulatory Visit | Attending: Advanced Practice Midwife | Admitting: Advanced Practice Midwife

## 2019-01-02 ENCOUNTER — Other Ambulatory Visit: Payer: Self-pay

## 2019-01-02 ENCOUNTER — Encounter: Payer: Self-pay | Admitting: Advanced Practice Midwife

## 2019-01-02 VITALS — BP 120/70 | Wt 188.0 lb

## 2019-01-02 DIAGNOSIS — O0991 Supervision of high risk pregnancy, unspecified, first trimester: Secondary | ICD-10-CM | POA: Diagnosis not present

## 2019-01-02 DIAGNOSIS — O99111 Other diseases of the blood and blood-forming organs and certain disorders involving the immune mechanism complicating pregnancy, first trimester: Secondary | ICD-10-CM

## 2019-01-02 DIAGNOSIS — O99119 Other diseases of the blood and blood-forming organs and certain disorders involving the immune mechanism complicating pregnancy, unspecified trimester: Secondary | ICD-10-CM

## 2019-01-02 DIAGNOSIS — D6851 Activated protein C resistance: Secondary | ICD-10-CM

## 2019-01-02 DIAGNOSIS — Z113 Encounter for screening for infections with a predominantly sexual mode of transmission: Secondary | ICD-10-CM | POA: Insufficient documentation

## 2019-01-02 DIAGNOSIS — O99211 Obesity complicating pregnancy, first trimester: Secondary | ICD-10-CM

## 2019-01-02 DIAGNOSIS — Z3A01 Less than 8 weeks gestation of pregnancy: Secondary | ICD-10-CM | POA: Diagnosis not present

## 2019-01-02 DIAGNOSIS — O0990 Supervision of high risk pregnancy, unspecified, unspecified trimester: Secondary | ICD-10-CM

## 2019-01-02 DIAGNOSIS — Z3201 Encounter for pregnancy test, result positive: Secondary | ICD-10-CM

## 2019-01-02 DIAGNOSIS — Z6832 Body mass index (BMI) 32.0-32.9, adult: Secondary | ICD-10-CM

## 2019-01-02 DIAGNOSIS — O99210 Obesity complicating pregnancy, unspecified trimester: Secondary | ICD-10-CM

## 2019-01-02 LAB — POCT URINALYSIS DIPSTICK OB
Glucose, UA: NEGATIVE
POC,PROTEIN,UA: NEGATIVE

## 2019-01-02 LAB — POCT URINE PREGNANCY: Preg Test, Ur: POSITIVE — AB

## 2019-01-02 NOTE — Patient Instructions (Addendum)
Factor V Leiden Test Why am I having this test? The factor V Leiden test is done to determine whether you have a gene mutation that increases your risk of developing blood clots. This gene mutation is called factor V Leiden thrombophilia. It is passed down through families (inherited). Your health care provider may perform this test if you:  Experienced an unexplained clotting event.  Have a family history of blood clots.  Had a blood clot before the age of 70.  Had a blood clot during pregnancy or while taking birth control pills.  Had a blood clot in an unusual vein location.  Had a blood clot in an artery. What is being tested? This test checks for the factor V Leiden genetic mutation through DNA analysis of the factor V gene. Factor V is an important protein involved in normal blood clotting. The abnormal form of factor V (factor V Leiden) can increase the risk of blood clotting. What kind of sample is taken?  A blood sample is required for this test. It is usually collected by inserting a needle into a blood vessel. Tell a health care provider about:  All medicines you are taking, including vitamins, herbs, eye drops, creams, and over-the-counter medicines.  Any medical conditions you have.  Any blood disorders you have. How are the results reported? Your test results will be reported as either positive or negative for the factor V Leiden genetic mutation. If the results are positive, more specific information about the genetic mutation may also be included. What do the results mean? A negative test result means that no genetic defect was found. A positive test result indicates the presence of the factor V Leiden genetic mutation. People receive a factor V gene from each of their parents, so the mutation may be either of the following:  Heterozygous. This means that only one of the two factor V genes has a mutation. A person with this result has a slightly increased risk for  clotting.  Homozygous. This means that both of the factor V genes have a mutation. A person with this result has a much higher risk of clotting. Talk with your health care provider about what your results mean. Questions to ask your health care provider Ask your health care provider, or the department that is doing the test:  When will my results be ready?  How will I get my results?  What are my treatment options?  What other tests do I need?  What are my next steps? Summary  The factor V Leiden test is done to determine whether you have an inherited gene mutation that increases your risk of developing blood clots.  A positive test result indicates the presence of the factor V Leiden genetic mutation.  Talk with your health care provider about what your results may mean. This information is not intended to replace advice given to you by your health care provider. Make sure you discuss any questions you have with your health care provider. Document Released: 05/27/2004 Document Revised: 12/13/2016 Document Reviewed: 12/13/2016 Elsevier Patient Education  2020 [employer]. Exercise During Pregnancy Exercise is an important part of being healthy for people of all ages. Exercise improves the function of your heart and lungs and helps you maintain strength, flexibility, and a healthy body weight. Exercise also boosts energy levels and elevates mood. Most women should exercise regularly during pregnancy. In rare cases, women with certain medical conditions or complications may be asked to limit or avoid exercise during  pregnancy. How does this affect me? Along with maintaining general strength and flexibility, exercising during pregnancy can help:  Keep strength in muscles that are used during labor and childbirth.  Decrease low back pain.  Reduce symptoms of depression.  Control weight gain during pregnancy.  Reduce the risk of needing insulin if you develop diabetes during  pregnancy.  Decrease the risk of cesarean delivery.  Speed up your recovery after giving birth. How does this affect my baby? Exercise can help you have a healthy pregnancy. Exercise does not cause premature birth. It will not cause your baby to weigh less at birth. What exercises can I do? Many exercises are safe for you to do during pregnancy. Do a variety of exercises that safely increase your heart and breathing rates and help you build and maintain muscle strength. Do exercises exactly as told by your health care provider. You may do these exercises:  Walking or hiking.  Swimming.  Water aerobics.  Riding a stationary bike.  Strength training.  Modified yoga or Pilates. Tell your instructor that you are pregnant. Avoid overstretching, and avoid lying on your back for long periods of time.  Running or jogging. Only choose this type of exercise if you: ? Ran or jogged regularly before your pregnancy. ? Can run or jog and still talk in complete sentences. What exercises should I avoid? Depending on your level of fitness and whether you exercised regularly before your pregnancy, you may be told to limit high-intensity exercise. You can tell that you are exercising at a high intensity if you are breathing much harder and faster and cannot hold a conversation while exercising. You must avoid:  Contact sports.  Activities that put you at risk for falling on or being hit in the belly, such as downhill skiing, water skiing, surfing, rock climbing, cycling, gymnastics, and horseback riding.  Scuba diving.  Skydiving.  Yoga or Pilates in a room that is heated to high temperatures.  Jogging or running, unless you ran or jogged regularly before your pregnancy. While jogging or running, you should always be able to talk in full sentences. Do not run or jog so fast that you are unable to have a conversation.  Do not exercise at more than 6,000 feet above sea level (high elevation) if  you are not used to exercising at high elevation. How do I exercise in a safe way?   Avoid overheating. Do not exercise in very high temperatures.  Wear loose-fitting, breathable clothes.  Avoid dehydration. Drink enough water before, during, and after exercise to keep your urine pale yellow.  Avoid overstretching. Because of hormone changes during pregnancy, it is easy to overstretch muscles, tendons, and ligaments during pregnancy.  Start slowly and ask your health care provider to recommend the types of exercise that are safe for you.  Do not exercise to lose weight. Follow these instructions at home:  Exercise on most days or all days of the week. Try to exercise for 30 minutes a day, 5 days a week, unless your health care provider tells you not to.  If you actively exercised before your pregnancy and you are healthy, your health care provider may tell you to continue to do moderate to high-intensity exercise.  If you are just starting to exercise or did not exercise much before your pregnancy, your health care provider may tell you to do low to moderate-intensity exercise. Questions to ask your health care provider  Is exercise safe for me?  What  are signs that I should stop exercising?  Does my health condition mean that I should not exercise during pregnancy?  When should I avoid exercising during pregnancy? Stop exercising and contact a health care provider if: You have any unusual symptoms, such as:  Mild contractions of the uterus or cramps in the abdomen.  Dizziness that does not go away when you rest. Stop exercising and get help right away if: You have any unusual symptoms, such as:  Sudden, severe pain in your low back or your belly.  Mild contractions of the uterus or cramps in the abdomen that do not improve with rest and drinking fluids.  Chest pain.  Bleeding or fluid leaking from your vagina.  Shortness of breath. These symptoms may represent a  serious problem that is an emergency. Do not wait to see if the symptoms will go away. Get medical help right away. Call your local emergency services (911 in the [place] Do not drive yourself to the hospital. Summary  Most women should exercise regularly throughout pregnancy. In rare cases, women with certain medical conditions or complications may be asked to limit or avoid exercise during pregnancy.  Do not exercise to lose weight during pregnancy.  Your health care provider will tell you what level of physical activity is right for you.  Stop exercising and contact a health care provider if you have mild contractions of the uterus or cramps in the abdomen. Get help right away if these contractions or cramps do not improve with rest and drinking fluids.  Stop exercising and get help right away if you have sudden, severe pain in your low back or belly, chest pain, shortness of breath, or bleeding or leaking of fluid from your vagina. This information is not intended to replace advice given to you by your health care provider. Make sure you discuss any questions you have with your health care provider. Document Released: 04/24/2005 Document Revised: 08/15/2018 Document Reviewed: 05/29/2018 Elsevier Patient Education  2020 [employer]. Eating Plan for Pregnant Women While you are pregnant, your body requires additional nutrition to help support your growing baby. You also have a higher need for some vitamins and minerals, such as folic acid, calcium, iron, and vitamin D. Eating a healthy, well-balanced diet is very important for your health and your baby's health. Your need for extra calories varies for the three 42-month segments of your pregnancy (trimesters). For most women, it is recommended to consume:  150 extra calories a day during the first trimester.  300 extra calories a day during the second trimester.  300 extra calories a day during the third trimester. What are tips for  following this plan?   Do not try to lose weight or go on a diet during pregnancy.  Limit your overall intake of foods that have "empty calories." These are foods that have little nutritional value, such as sweets, desserts, candies, and sugar-sweetened beverages.  Eat a variety of foods (especially fruits and vegetables) to get a full range of vitamins and minerals.  Take a prenatal vitamin to help meet your additional vitamin and mineral needs during pregnancy, specifically for folic acid, iron, calcium, and vitamin D.  Remember to stay active. Ask your health care provider what types of exercise and activities are safe for you.  Practice good food safety and cleanliness. Wash your hands before you eat and after you prepare raw meat. Wash all fruits and vegetables well before peeling or eating. Taking these actions can help to  prevent food-borne illnesses that can be very dangerous to your baby, such as listeriosis. Ask your health care provider for more information about listeriosis. What does 150 extra calories look like? Healthy options that provide 150 extra calories each day could be any of the following:  6-8 oz (170-230 g) of plain low-fat yogurt with  cup of berries.  1 apple with 2 teaspoons (11 g) of peanut butter.  Cut-up vegetables with  cup (60 g) of hummus.  8 oz (230 mL) or 1 cup of low-fat chocolate milk.  1 stick of string cheese with 1 medium orange.  1 peanut butter and jelly sandwich that is made with one slice of whole-wheat bread and 1 tsp (5 g) of peanut butter. For 300 extra calories, you could eat two of those healthy options each day. What is a healthy amount of weight to gain? The right amount of weight gain for you is based on your BMI before you became pregnant. If your BMI:  Was less than 18 (underweight), you should gain 28-40 lb (13-18 kg).  Was 18-24.9 (normal), you should gain 25-35 lb (11-16 kg).  Was 25-29.9 (overweight), you should gain  15-25 lb (7-11 kg).  Was 30 or greater (obese), you should gain 11-20 lb (5-9 kg). What if I am having twins or multiples? Generally, if you are carrying twins or multiples:  You may need to eat 300-600 extra calories a day.  The recommended range for total weight gain is 25-54 lb (11-25 kg), depending on your BMI before pregnancy.  Talk with your health care provider to find out about nutritional needs, weight gain, and exercise that is right for you. What foods can I eat?  Grains All grains. Choose whole grains, such as whole-wheat bread, oatmeal, or brown rice. Vegetables All vegetables. Eat a variety of colors and types of vegetables. Remember to wash your vegetables well before peeling or eating. Fruits All fruits. Eat a variety of colors and types of fruit. Remember to wash your fruits well before peeling or eating. Meats and other protein foods Lean meats, including chicken, [place], fish, and lean cuts of beef, veal, or pork. If you eat fish or seafood, choose options that are higher in omega-3 fatty acids and lower in mercury, such as salmon, herring, mussels, trout, sardines, pollock, shrimp, crab, and lobster. Tofu. Tempeh. Beans. Eggs. Peanut butter and other nut butters. Make sure that all meats, poultry, and eggs are cooked to food-safe temperatures or "well-done." Two or more servings of fish are recommended each week in order to get the most benefits from omega-3 fatty acids that are found in seafood. Choose fish that are lower in mercury. You can find more information online:  PumpkinSearch.com.ee Dairy Pasteurized milk and milk alternatives (such as almond milk). Pasteurized yogurt and pasteurized cheese. Cottage cheese. Sour cream. Beverages Water. Juices that contain 100% fruit juice or vegetable juice. Caffeine-free teas and decaffeinated coffee. Drinks that contain caffeine are okay to drink, but it is better to avoid caffeine. Keep your total caffeine intake to less than 200  mg each day (which is 12 oz or 355 mL of coffee, tea, or soda) or the limit as told by your health care provider. Fats and oils Fats and oils are okay to include in moderation. Sweets and desserts Sweets and desserts are okay to include in moderation. Seasoning and other foods All pasteurized condiments. The items listed above may not be a complete list of recommended foods and beverages. Contact your  dietitian for more options. The items listed above may not be a complete list of foods and beverages [you/your child] can eat. Contact a dietitian for more information. What foods are not recommended? Vegetables Raw (unpasteurized) vegetable juices. Fruits Unpasteurized fruit juices. Meats and other protein foods Lunch meats, bologna, hot dogs, or other deli meats. (If you must eat those meats, reheat them until they are steaming hot.) Refrigerated pat, meat spreads from a meat counter, smoked seafood that is found in the refrigerated section of a store. Raw or undercooked meats, poultry, and eggs. Raw fish, such as sushi or sashimi. Fish that have high mercury content, such as tilefish, shark, swordfish, and king mackerel. To learn more about mercury in fish, talk with your health care provider or look for online resources, such as:  [place]Gaming.ch Dairy Raw (unpasteurized) milk and any foods that have raw milk in them. Soft cheeses, such as feta, queso blanco, queso fresco, Brie, Camembert cheeses, blue-veined cheeses, and Panela cheese (unless it is made with pasteurized milk, which must be stated on the label). Beverages Alcohol. Sugar-sweetened beverages, such as sodas, teas, or energy drinks. Seasoning and other foods Homemade fermented foods and drinks, such as pickles, sauerkraut, or kombucha drinks. (Store-bought pasteurized versions of these are okay.) Salads that are made in a store or deli, such as ham salad, chicken salad, egg salad, tuna salad, and seafood salad. The items listed  above may not be a complete list of foods and beverages to avoid. Contact your dietitian for more information. The items listed above may not be a complete list of foods and beverages [you/your child] should avoid. Contact a dietitian for more information. Where to find more information To calculate the number of calories you need based on your height, weight, and activity level, you can use an online calculator such as:  MobileTransition.ch To calculate how much weight you should gain during pregnancy, you can use an online pregnancy weight gain calculator such as:  StreamingFood.com.cy Summary  While you are pregnant, your body requires additional nutrition to help support your growing baby.  Eat a variety of foods, especially fruits and vegetables to get a full range of vitamins and minerals.  Practice good food safety and cleanliness. Wash your hands before you eat and after you prepare raw meat. Wash all fruits and vegetables well before peeling or eating. Taking these actions can help to prevent food-borne illnesses, such as listeriosis, that can be very dangerous to your baby.  Do not eat raw meat or fish. Do not eat fish that have high mercury content, such as tilefish, shark, swordfish, and king mackerel. Do not eat unpasteurized (raw) dairy.  Take a prenatal vitamin to help meet your additional vitamin and mineral needs during pregnancy, specifically for folic acid, iron, calcium, and vitamin D. This information is not intended to replace advice given to you by your health care provider. Make sure you discuss any questions you have with your health care provider. Document Released: 02/06/2014 Document Revised: 08/15/2018 Document Reviewed: 01/19/2017 Elsevier Patient Education  2020 [employer]. Prenatal Care Prenatal care is health care during pregnancy. It helps you and your unborn baby (fetus) stay as healthy as possible. Prenatal  care may be provided by a midwife, a family practice health care provider, or a childbirth and pregnancy specialist (obstetrician). How does this affect me? During pregnancy, you will be closely monitored for any new conditions that might develop. To lower your risk of pregnancy complications, you and your health  care provider will talk about any underlying conditions you have. How does this affect my baby? Early and consistent prenatal care increases the chance that your baby will be healthy during pregnancy. Prenatal care lowers the risk that your baby will be:  Born early (prematurely).  Smaller than expected at birth (small for gestational age). What can I expect at the first prenatal care visit? Your first prenatal care visit will likely be the longest. You should schedule your first prenatal care visit as soon as you know that you are pregnant. Your first visit is a good time to talk about any questions or concerns you have about pregnancy. At your visit, you and your health care provider will talk about:  Your medical history, including: ? Any past pregnancies. ? Your family's medical history. ? The baby's father's medical history. ? Any long-term (chronic) health conditions you have and how you manage them. ? Any surgeries or procedures you have had. ? Any current over-the-counter or prescription medicines, herbs, or supplements you are taking.  Other factors that could pose a risk to your baby, including:  Your home setting and your stress levels, including: ? Exposure to abuse or violence. ? Household financial strain. ? Mental health conditions you have.  Your daily health habits, including diet and exercise. Your health care provider will also:  Measure your weight, height, and blood pressure.  Do a physical exam, including a pelvic and breast exam.  Perform blood tests and urine tests to check for: ? Urinary tract infection. ? Sexually transmitted infections  (STIs). ? Low iron levels in your blood (anemia). ? Blood type and certain proteins on red blood cells (Rh antibodies). ? Infections and immunity to viruses, such as hepatitis B and rubella. ? HIV (human immunodeficiency virus).  Do an ultrasound to confirm your baby's growth and development and to help predict your estimated due date (EDD). This ultrasound is done with a probe that is inserted into the vagina (transvaginal ultrasound).  Discuss your options for genetic screening.  Give you information about how to keep yourself and your baby healthy, including: ? Nutrition and taking vitamins. ? Physical activity. ? How to manage pregnancy symptoms such as nausea and vomiting (morning sickness). ? Infections and substances that may be harmful to your baby and how to avoid them. ? Food safety. ? Dental care. ? Working. ? Travel. ? Warning signs to watch for and when to call your health care provider. How often will I have prenatal care visits? After your first prenatal care visit, you will have regular visits throughout your pregnancy. The visit schedule is often as follows:  Up to week 28 of pregnancy: once every 4 weeks.  28-36 weeks: once every 2 weeks.  After 36 weeks: every week until delivery. Some women may have visits more or less often depending on any underlying health conditions and the health of the baby. Keep all follow-up and prenatal care visits as told by your health care provider. This is important. What happens during routine prenatal care visits? Your health care provider will:  Measure your weight and blood pressure.  Check for fetal heart sounds.  Measure the height of your uterus in your abdomen (fundal height). This may be measured starting around week 20 of pregnancy.  Check the position of your baby inside your uterus.  Ask questions about your diet, sleeping patterns, and whether you can feel the baby move.  Review warning signs to watch for and  signs of labor.  Ask about any pregnancy symptoms you are having and how you are dealing with them. Symptoms may include: ? Headaches. ? Nausea and vomiting. ? Vaginal discharge. ? Swelling. ? Fatigue. ? Constipation. ? Any discomfort, including back or pelvic pain. Make a list of questions to ask your health care provider at your routine visits. What tests might I have during prenatal care visits? You may have blood, urine, and imaging tests throughout your pregnancy, such as:  Urine tests to check for glucose, protein, or signs of infection.  Glucose tests to check for a form of diabetes that can develop during pregnancy (gestational diabetes mellitus). This is usually done around week 24 of pregnancy.  An ultrasound to check your baby's growth and development and to check for birth defects. This is usually done around week 20 of pregnancy.  A test to check for group B strep (GBS) infection. This is usually done around week 36 of pregnancy.  Genetic testing. This may include blood or imaging tests, such as an ultrasound. Some genetic tests are done during the first trimester and some are done during the second trimester. What else can I expect during prenatal care visits? Your health care provider may recommend getting certain vaccines during pregnancy. These may include:  A yearly flu shot (annual influenza vaccine). This is especially important if you will be pregnant during flu season.  Tdap (tetanus, diphtheria, pertussis) vaccine. Getting this vaccine during pregnancy can protect your baby from whooping cough (pertussis) after birth. This vaccine may be recommended between weeks 27 and 36 of pregnancy. Later in your pregnancy, your health care provider may give you information about:  Childbirth and breastfeeding classes.  Choosing a health care provider for your baby.  Umbilical cord banking.  Breastfeeding.  Birth control after your baby is born.  The hospital labor  and delivery unit and how to tour it.  Registering at the hospital before you go into labor. Where to find more information  Office on Women's Health: TravelLesson.ca  American Pregnancy Association: americanpregnancy.org  March of Dimes: marchofdimes.org Summary  Prenatal care helps you and your baby stay as healthy as possible during pregnancy.  Your first prenatal care visit will most likely be the longest.  You will have visits and tests throughout your pregnancy to monitor your health and your baby's health.  Bring a list of questions to your visits to ask your health care provider.  Make sure to keep all follow-up and prenatal care visits with your health care provider. This information is not intended to replace advice given to you by your health care provider. Make sure you discuss any questions you have with your health care provider. Document Released: 04/27/2003 Document Revised: 08/14/2018 Document Reviewed: 04/23/2017 Elsevier Patient Education  2020 [employer].    COVID-19 and Your Pregnancy FAQ  How can I prevent infection with COVID-19 during my pregnancy? Social distancing is key. Please limit any interactions in public. Try and work from home if possible. Frequently wash your hands after touching possibly contaminated surfaces. Avoid touching your face.  Minimize trips to the store. Consider online ordering when possible.   Should I wear a mask? YES. It is recommended by the CDC that all people wear a cloth mask or facial covering in public. You should wear a mask to your visits in the office. This will help reduce transmission as well as your risk or acquiring COVID-19. New studies are showing that even asymptomatic individuals can spread the virus from talking.  Where can I get a mask? [place] and the city of [place] are partnering to provide masks to community members. You can pick up a mask from several locations. This website also has  instructions about how to make a mask by sewing or without sewing by using a t-shirt or bandana.  https://www.[place].gov/i-want-to/learn-about/covid-19-information-and-updates/covid-19-face-mask-project  Studies have shown that if you were a tube or nylon stocking from pantyhose over a cloth mask it makes the cloth mask almost as effective as a N95 mask.  [employer].dehttps://www.npr.org/sections/goatsandsoda/2018/08/28/840146830/adding-a-nylon-stocking-layer-could-boost-protection-from-cloth-masks-study-find  What are the symptoms of COVID-19? Fever (greater than 100.4 F), dry cough, shortness of breath.  Am I more at risk for COVID-19 since I am pregnant? There is not currently data showing that pregnant women are more adversely impacted by COVID-19 than the general population. However, we know that pregnant women tend to have worse respiratory complications from similar diseases such as the flu and SARS and for this reason should be considered an at-risk population.  What do I do if I am experiencing the symptoms of COVID-19? Testing is being limited because of test availability. If you are experiencing symptoms you should quarantine yourself, and the members of your family, for at least 2 weeks at home.   Please visit this website for more information: DiscoHelp.sihttps://www.cdc.gov/coronavirus/2019-ncov/if-you-are-sick/steps-when-sick.html  When should I go to the Emergency Room? Please go to the emergency room if you are experiencing ANY of these symptoms*:  1.    Difficulty breathing or shortness of breath 2.    Persistent pain or pressure in the chest 3.    Confusion or difficulty being aroused (or awakened) 4.    Bluish lips or face  *This list is not all inclusive. Please consult our office for any other symptoms that are severe or concerning.  What do I do if I am having difficulty breathing? You should go to the Emergency Room for evaluation. At this time they have a tent set up for  evaluating patients with COVID-19 symptoms.   How will my prenatal care be different because of the COVID-19 pandemic? It has been recommended to reduce the frequency of face-to-face visits and use resources such as telephone and virtual visits when possible. Using a scale, blood pressure machine and fetal doppler at home can further help reduce face-to-face visits. You will be provided with additional information on this topic.  We ask that you come to your visits alone to minimize potential exposures to  COVID-19.  How can I receive childbirth education? At this time in-person classes have been cancelled. You can register for online childbirth education, breastfeeding, and newborn care classes.  Please visit:  [email or web address] for more information  How will my hospital birth experience be different? The hospital is currently limiting visitors. This means that while you are in labor you can only have one person at the hospital with you. Additional family members will not be allowed to wait in the building or outside your room. Your one support person can be the father of the baby, a relative, a doula, or a friend. Once one support person is designated that person will wear a band. This band cannot be shared with multiple people.  Nitrous Gas is not being offered for pain relief since the tubing and filter for the machine can not be sanitized in a way to guarantee prevention of transmission of COVID-19.  Nasal cannula use of oxygen for fetal indications has also been discontinued.  Currently a clear plastic sheet is being hung between mom and  the delivering provider during pushing and delivery to help prevent transmission of COVID-19.      How long will I stay in the hospital for after giving birth? It is also recommended that discharge home be expedited during the COVID-19 outbreak. This means staying for 1 day after a vaginal delivery and 2 days after a cesarean section. Patients who  need to stay longer for medical reasons are allowed to do so, but the goal will be for expedited discharge home.   What if I have COVID-19 and I am in labor? We ask that you wear a mask while on labor and delivery. We will try and accommodate you being placed in a room that is capable of filtering the air. Please call ahead if you are in labor and on your way to the hospital. The phone number for labor and delivery at [facility] is [phone].  If I have COVID-19 when my baby is born how can I prevent my baby from contracting COVID-19? This is an issue that will have to be discussed on a case-by-case basis. Current recommendations suggest providing separate isolation rooms for both the mother and new infant as well as limiting visitors. However, there are practical challenges to this recommendation. The situation will assuredly change and decisions will be influenced by the desires of the mother and availability of space.  Some suggestions are the use of a curtain or physical barrier between mom and infant, hand hygiene, mom wearing a mask, or 6 feet of spacing between a mom and infant.   Can I breastfeed during the COVID-19 pandemic?   Yes, breastfeeding is encouraged.  Can I breastfeed if I have COVID-19? Yes. Covid-19 has not been found in breast milk. This means you cannot give COVID-19 to your child through breast milk. Breast feeding will also help pass antibodies to fight infection to your baby.   What precautions should I take when breastfeeding if I have COVID-19? If a mother and newborn do room-in and the mother wishes to feed at the breast, she should put on a facemask and practice hand hygiene before each feeding.  What precautions should I take when pumping if I have COVID-19? Prior to expressing breast milk, mothers should practice hand hygiene. After each pumping session, all parts that come into contact with breast milk should be thoroughly washed and the  entire pump should be appropriately disinfected per the manufacturers instructions. This expressed breast milk should be fed to the newborn by a healthy caregiver.  What if I am pregnant and work in healthcare? Based on limited data regarding COVID-19 and pregnancy, ACOG currently does not propose creating additional restrictions on pregnant health care personnel because of COVID-19 alone. Pregnant women do not appear to be at higher risk of severe disease related to COVID-19. Pregnant health care personnel should follow CDC risk assessment and infection control guidelines for health care personnel exposed to patients with suspected or confirmed COVID-19. Adherence to recommended infection prevention and control practices is an important part of protecting all health care personnel in health care settings.    Information on COVID-19 in pregnancy is very limited; however, facilities may want to consider limiting exposure of pregnant health care personnel to patients with confirmed or suspected COVID-19 infection, especially during higher-risk procedures (eg, aerosol-generating procedures), if feasible, based on staffing availability.

## 2019-01-03 DIAGNOSIS — O99210 Obesity complicating pregnancy, unspecified trimester: Secondary | ICD-10-CM | POA: Insufficient documentation

## 2019-01-03 DIAGNOSIS — O0990 Supervision of high risk pregnancy, unspecified, unspecified trimester: Secondary | ICD-10-CM | POA: Insufficient documentation

## 2019-01-03 NOTE — Progress Notes (Signed)
New Obstetric Patient H&P    Chief Complaint: "Desires prenatal care"   History of Present Illness: Patient is a 33 y.o. [id number] Not Hispanic or Latino female, presents with amenorrhea and positive home pregnancy test. Patient's last menstrual period was 11/22/2018 (exact date). and based on her  LMP, her EDD is Estimated Date of Delivery: 08/29/19 and her EGA is [redacted]w[redacted]d. Cycles are 4. days, regular, and occur approximately every : 28 days. Her last pap smear was 1 years ago and was no abnormalities. PAP done in [place]. Will have patient sign release for records.   She had a urine pregnancy test which was positive 2 week(s)  ago. Her last menstrual period was normal and lasted for  4 day(s). Since her LMP she claims she has experienced breast tenderness, fatigue occasional nausea. She denies vaginal bleeding. Her past medical history is contributory for Heterozygous Factor V Leiden. Her prior pregnancies are notable for G1 2013 FT C/S for arrest of descent 8#, G2 2014 PT RC/S at 36 wks for previa, G3 2018 late SAB with D&E, G4 2019 late SAB with D&E. Patient was seen in February of this year and diagnosed with HZ Factor V. She was advised that she will need daily [place] Lovenox during pregnancy. She is open to the idea of TOLAC.  Since her LMP, she admits to the use of tobacco products  no She claims she has gained   no pounds since the start of her pregnancy.  There are cats in the home in the home  no  She admits close contact with children on a regular basis  yes  She has had chicken pox in the past yes She has had Tuberculosis exposures, symptoms, or previously tested positive for TB   no Current or past history of domestic violence. no  Genetic Screening/Teratology Counseling: (Includes patient, baby's father, or anyone in either family with:)   13. Patient's age >/= 4 at [facility]  no 2. Thalassemia ([place], [place], [place], or Asian background): MCV<80  no 3. Neural tube defect  (meningomyelocele, spina bifida, anencephaly)  no 4. Congenital heart defect  no  5. Down syndrome  no 6. Tay-Sachs (Jewish, [place])  no 7. Canavan's Disease  no 8. Sickle cell disease or trait (African)  no  9. Hemophilia or other blood disorders  no  10. Muscular dystrophy  no  11. Cystic fibrosis  no  12. Huntington's Chorea  no  13. Mental retardation/autism  no 14. Other inherited genetic or chromosomal disorder  no 15. Maternal metabolic disorder (DM, PKU, etc)  no 16. Patient or FOB with a child with a birth defect not listed above no  16a. Patient or FOB with a birth defect themselves no 17. Recurrent pregnancy loss, or stillbirth  no  18. Any medications since LMP other than prenatal vitamins (include vitamins, supplements, OTC meds, drugs, alcohol)  no 19. Any other genetic/environmental exposure to discuss  Patient's sister has septate uterus and born with 1 kidney.  Infection History:   1. Lives with someone with TB or TB exposed  no  2. Patient or partner has history of genital herpes  no 3. Rash or viral illness since LMP  no 4. History of STI (GC, CT, HPV, syphilis, HIV)  no 5. History of recent travel :  no  Other pertinent information:  no     Review of Systems:10 point review of systems negative unless otherwise noted in HPI  Past Medical History:  Past  Medical History:  Diagnosis Date  . No known health problems   . Urinary tract infection     Past Surgical History:  Past Surgical History:  Procedure Laterality Date  . CESAREAN SECTION  10/10/2011   Procedure: CESAREAN SECTION;  Surgeon: Meriel Pica, MD;  Location: [facility];  Service: Gynecology;  Laterality: N/A;  . CESAREAN SECTION  04/10/2013   Placental previa  . NO PAST SURGERIES      Gynecologic History: Patient's last menstrual period was 11/22/2018 (exact date).  Obstetric History: [id number]  Family History:  Family History  Problem Relation Age of Onset  . Colon cancer  Maternal Grandmother 48  . Anesthesia problems Neg Hx     Social History:  Social History   Socioeconomic History  . Marital status: Married    Spouse name: Not on file  . Number of children: Not on file  . Years of education: Not on file  . Highest education level: Not on file  Occupational History  . Not on file  Social Needs  . Financial resource strain: Not on file  . Food insecurity    Worry: Not on file    Inability: Not on file  . Transportation needs    Medical: Not on file    Non-medical: Not on file  Tobacco Use  . Smoking status: Never Smoker  . Smokeless tobacco: Never Used  Substance and Sexual Activity  . Alcohol use: No  . Drug use: No  . Sexual activity: Yes    Birth control/protection: None  Lifestyle  . Physical activity    Days per week: Not on file    Minutes per session: Not on file  . Stress: Not on file  Relationships  . Social Musician on phone: Not on file    Gets together: Not on file    Attends religious service: Not on file    Active member of club or organization: Not on file    Attends meetings of clubs or organizations: Not on file    Relationship status: Not on file  . Intimate partner violence    Fear of current or ex partner: Not on file    Emotionally abused: Not on file    Physically abused: Not on file    Forced sexual activity: Not on file  Other Topics Concern  . Not on file  Social History Narrative  . Not on file    Allergies:  No Known Allergies  Medications: Prior to Admission medications   Medication Sig Start Date End Date Taking? Authorizing Provider  Cranberry 300 MG tablet Take 300 mg by mouth 2 (two) times daily.   Yes [provider]  Prenatal Vit-DSS-Fe Cbn-FA (PRENATAL AD PO) Take by mouth.   Yes [provider]    Physical Exam Vitals: Blood pressure 120/70, weight 188 lb (85.3 kg), last menstrual period 11/22/2018, unknown if currently breastfeeding.  General: NAD  HEENT: normocephalic, anicteric Thyroid: no enlargement, no palpable nodules Pulmonary: No increased work of breathing, CTAB Cardiovascular: RRR, distal pulses 2+ Abdomen: NABS, soft, non-tender, non-distended.  Umbilicus without lesions.  No hepatomegaly, splenomegaly or masses palpable. No evidence of hernia  Genitourinary: deferred for no concerns/PAP interval Extremities: no edema, erythema, or tenderness Neurologic: Grossly intact Psychiatric: mood appropriate, affect full   Assessment: 33 y.o. [id number] at [redacted]w[redacted]d presenting to initiate prenatal care  Plan: 1) Avoid alcoholic beverages. 2) Patient encouraged not to smoke.  3) Discontinue the use of all  non-medicinal drugs and chemicals.  4) Take prenatal vitamins daily.  5) Nutrition, food safety (fish, cheese advisories, and high nitrite foods) and exercise discussed. 6) Hospital and practice style discussed with cross coverage system.  7) Genetic Screening, such as with 1st Trimester Screening, cell free fetal DNA, AFP testing, and Ultrasound, as well as with amniocentesis and CVS as appropriate, is discussed with patient. At the conclusion of today's visit patient declined genetic testing 8) Patient is asked about travel to areas at risk for the [place] virus, and counseled to avoid travel and exposure to mosquitoes or sexual partners who may have themselves been exposed to the virus. Testing is discussed, and will be ordered as appropriate.  9) Urine aptima and urine culture done today 10) Return on 01/14/19 for dating scan, early 1 hr gtt, NOB panel and ROB, labs are future ordered. Also desires flu vaccine at dating visit. 11) Heterozygous Factor V Leiden: reviewed patient history with Dr Bonney AidStaebler. There is limited evidence for increase in fetal loss less than [redacted] weeks gestation in women with Factor V who do not have a personal history of VTE or an affected first degree family member. Can offer daily baby ASA. If patient prefers, can  consider Lovenox 40 mg [place] daily.    Misty Carr, CNM [facility] 01/03/2019, 10:55 AM

## 2019-01-04 LAB — CERVICOVAGINAL ANCILLARY ONLY
Chlamydia: NEGATIVE
Neisseria Gonorrhea: NEGATIVE
Trichomonas: NEGATIVE

## 2019-01-04 LAB — URINE CULTURE

## 2019-01-06 DIAGNOSIS — F411 Generalized anxiety disorder: Secondary | ICD-10-CM | POA: Diagnosis not present

## 2019-01-14 ENCOUNTER — Other Ambulatory Visit: Payer: Self-pay

## 2019-01-14 ENCOUNTER — Encounter: Payer: Self-pay | Admitting: Obstetrics and Gynecology

## 2019-01-14 ENCOUNTER — Ambulatory Visit (INDEPENDENT_AMBULATORY_CARE_PROVIDER_SITE_OTHER): Payer: BC Managed Care – PPO | Admitting: Obstetrics and Gynecology

## 2019-01-14 ENCOUNTER — Ambulatory Visit (INDEPENDENT_AMBULATORY_CARE_PROVIDER_SITE_OTHER): Payer: BC Managed Care – PPO

## 2019-01-14 ENCOUNTER — Other Ambulatory Visit: Payer: BC Managed Care – PPO

## 2019-01-14 VITALS — BP 118/74 | Wt 188.0 lb

## 2019-01-14 DIAGNOSIS — D6851 Activated protein C resistance: Secondary | ICD-10-CM | POA: Diagnosis not present

## 2019-01-14 DIAGNOSIS — O99111 Other diseases of the blood and blood-forming organs and certain disorders involving the immune mechanism complicating pregnancy, first trimester: Secondary | ICD-10-CM | POA: Diagnosis not present

## 2019-01-14 DIAGNOSIS — O0991 Supervision of high risk pregnancy, unspecified, first trimester: Secondary | ICD-10-CM | POA: Diagnosis not present

## 2019-01-14 DIAGNOSIS — O99119 Other diseases of the blood and blood-forming organs and certain disorders involving the immune mechanism complicating pregnancy, unspecified trimester: Secondary | ICD-10-CM

## 2019-01-14 DIAGNOSIS — O0990 Supervision of high risk pregnancy, unspecified, unspecified trimester: Secondary | ICD-10-CM

## 2019-01-14 DIAGNOSIS — O99211 Obesity complicating pregnancy, first trimester: Secondary | ICD-10-CM | POA: Diagnosis not present

## 2019-01-14 DIAGNOSIS — O99210 Obesity complicating pregnancy, unspecified trimester: Secondary | ICD-10-CM | POA: Diagnosis not present

## 2019-01-14 DIAGNOSIS — Z3A01 Less than 8 weeks gestation of pregnancy: Secondary | ICD-10-CM | POA: Diagnosis not present

## 2019-01-14 DIAGNOSIS — Z113 Encounter for screening for infections with a predominantly sexual mode of transmission: Secondary | ICD-10-CM | POA: Diagnosis not present

## 2019-01-14 DIAGNOSIS — Z6832 Body mass index (BMI) 32.0-32.9, adult: Secondary | ICD-10-CM

## 2019-01-14 DIAGNOSIS — N8311 Corpus luteum cyst of right ovary: Secondary | ICD-10-CM | POA: Diagnosis not present

## 2019-01-14 DIAGNOSIS — O3481 Maternal care for other abnormalities of pelvic organs, first trimester: Secondary | ICD-10-CM | POA: Diagnosis not present

## 2019-01-14 DIAGNOSIS — Z6831 Body mass index (BMI) 31.0-31.9, adult: Secondary | ICD-10-CM | POA: Insufficient documentation

## 2019-01-14 NOTE — Progress Notes (Signed)
Routine Prenatal Care Visit  Subjective  Misty Carr is a 33 y.o. [phone] at 102w1d being seen today for ongoing prenatal care.  She is currently monitored for the following issues for this high-risk pregnancy and has Heterozygous factor V Leiden affecting pregnancy, antepartum ([place]); Supervision of high risk pregnancy, antepartum; Obesity affecting pregnancy, antepartum; and BMI 31.0-31.9,adult on their problem list.  ----------------------------------------------------------------------------------- Patient reports no complaints.    . Vag. Bleeding: None.  Movement: Absent. Leaking Fluid denies.  U/S changes EDD. Fluid vs. Blood, vs placental tissue in uterus.  See ultrasound report and images. ----------------------------------------------------------------------------------- The following portions of the patient's history were reviewed and updated as appropriate: allergies, current medications, past family history, past medical history, past social history, past surgical history and problem list. Problem list updated.  Objective  Blood pressure 118/74, weight 188 lb (85.3 kg), last menstrual period 11/22/2018, unknown if currently breastfeeding. Pregravid weight 185 lb (83.9 kg) Total Weight Gain 3 lb (1.361 kg) Urinalysis: Urine Protein    Urine Glucose    Fetal Status: Fetal Heart Rate (bpm): 108   Movement: Absent     General:  Alert, oriented and cooperative. Patient is in no acute distress.  Skin: Skin is warm and dry. No rash noted.   Cardiovascular: Normal heart rate noted  Respiratory: Normal respiratory effort, no problems with respiration noted  Abdomen: Soft, gravid, appropriate for gestational age. Pain/Pressure: Absent     Pelvic:  Cervical exam deferred        Extremities: Normal range of motion.     Mental Status: Normal mood and affect. Normal behavior. Normal judgment and thought content.   Imaging Results US Ob Comp Less 14 Wks  Result Date: 01/14/2019 Patient  Name: PRISILA DLOUHY DOB: 1985-06-03 MRN: [id number] ULTRASOUND REPORT Location: [facility] Date of Service: 01/14/2019 Indications:dating Findings: Nelda Marse[place] intrauterine pregnancy is visualized with a CRL consistent with [redacted]w[redacted]d gestation, giving an (U/S) EDD of 09/08/2019. The (U/S) EDD is not consistent with the clinically established EDD of 08/29/2019 FHR: 108 BPM CRL measurement: 4.2 mm Yolk sac is visualized and appears normal and early anatomy is normal. Amnion: not visualized The uterus has a significant area of hypoechoic material, which could be consistent with subchorionic hemorrhage, placental tissue.  Less likely would be twin or vanishing twin gestation.  This measured 4.8 x 2.1 x 1.4 cm. Right Ovary is normal in appearance. Left Ovary is normal appearance. Corpus luteal cyst:  Right ovary Survey of the adnexa demonstrates no adnexal masses. There is no free peritoneal fluid in the cul de sac. Impression: 1. [redacted]w[redacted]d Viable Singleton Intrauterine pregnancy by U/S. 2. (U/S) EDD is not consistent with Clinically established EDD of 08/29/2019. 3. A repeat u/s is needed for dating. 4. There is a subchorionic bleed measuring 4.8 x 2.1 x 1.4 cm 5. Postvoid residual in the bladder is 150 cc. Recommendations: 1.Clinical correlation with the patient's History and Physical Exam. 2. Follow up ultrasound in 1 week. Consider quantitative beta hCG for clinical correlation. Gweneth Dimitri, RT There is a viable singleton gestation.  Detailed evaluation of the fetal anatomy is precluded by early gestational age.  It must be noted that a normal ultrasound particular at this early gestational age is unable to rule out fetal aneuploidy, risk of first trimester miscarriage, or anatomic birth defects. Prentice Docker, MD, [facility], [facility] 01/14/2019 11:31 AM      Assessment   33 y.o. [id number] at [redacted]w[redacted]d by  09/08/2019,  by Ultrasound presenting for routine prenatal visit  Plan   pregnancy5  Problems (from 11/22/18 to present)    Problem Noted Resolved   BMI 31.0-31.9,adult 01/14/2019 by Conard NovakJackson, Taijah Macrae D, MD No   Supervision of high risk pregnancy, antepartum 01/03/2019 by Tresea MallGledhill, Jane, CNM No   Overview Signed 01/03/2019 11:37 AM by Tresea MallGledhill, Jane, CNM    Clinic [facility] Prenatal Labs  Dating  Blood type:     Genetic Screen 1 Screen:    AFP:     Quad:     NIPS: Antibody:   Anatomic US  Rubella:   Varicella: @VZVIGG @  GTT Early:               Third trimester:  RPR:     Rhogam  HBsAg:     TDaP vaccine                       Flu Shot: HIV:     Baby Food                                GBS:   Contraception  Pap:  CBB     CS/VBAC 2013 arrest of descent, 2014 previa Factor V Leiden Heterozygous: hx 2 late SABs  Support Person Husband Kathlene NovemberMike          Obesity affecting pregnancy, antepartum 01/03/2019 by Tresea MallGledhill, Jane, CNM No   Heterozygous factor V Leiden affecting pregnancy, antepartum (HCC) 06/19/2018 by Vena AustriaStaebler, Andreas, MD No   Overview Signed 01/14/2019 11:34 AM by Conard NovakJackson, Tieasha Larsen D, MD    - denies history of VTE - denies history of VTE in 1st degree relative - based on ACOG PB 197, does not need anticoagulation. With history of two 14 week losses, may consider aspirin.          Preterm labor symptoms and general obstetric precautions including but not limited to vaginal bleeding, contractions, leaking of fluid and fetal movement were reviewed in detail with the patient. Please refer to After Visit Summary for other counseling recommendations.   - quant hCG today  Return in about 1 week (around 01/21/2019) for U/S for u/s follow up and ROB with MD after.  Thomasene MohairStephen Fidel Caggiano, MD, Merlinda FrederickFACOG [facility], [facility] 01/14/2019 11:35 AM

## 2019-01-15 LAB — RPR+RH+ABO+RUB AB+AB SCR+CB...
Antibody Screen: NEGATIVE
HIV Screen 4th Generation wRfx: NONREACTIVE
Hematocrit: 37.3 % (ref 34.0–46.6)
Hemoglobin: 12.5 g/dL (ref 11.1–15.9)
Hepatitis B Surface Ag: NEGATIVE
MCH: 30.1 pg (ref 26.6–33.0)
MCHC: 33.5 g/dL (ref 31.5–35.7)
MCV: 90 fL (ref 79–97)
Platelets: 225 10*3/uL (ref 150–450)
RBC: 4.15 x10E6/uL (ref 3.77–5.28)
RDW: 12.8 % (ref 11.7–15.4)
RPR Ser Ql: NONREACTIVE
Rh Factor: POSITIVE
Rubella Antibodies, IGG: 1.36 index (ref >0.99)
Varicella zoster IgG: 526 index (ref >165)
WBC: 3.8 10*3/uL (ref 3.4–10.8)

## 2019-01-15 LAB — BETA HCG QUANT (REF LAB): hCG Quant: 3330 m[IU]/mL

## 2019-01-15 LAB — GLUCOSE, 1 HOUR GESTATIONAL: Gestational Diabetes Screen: 117 mg/dL (ref 65–139)

## 2019-01-16 ENCOUNTER — Telehealth: Payer: Self-pay

## 2019-01-16 NOTE — Telephone Encounter (Signed)
Pt called triage saying she has gone to bathroom twice (first time ws around 135 pm when she noticed) and when wiped had blood on tissue. No pain or heavy bleeding. Per JEG, might be coming from chorionic hemorrhage noticed on dating scan. If she bleeds heavily, soaking pads, she needs to go to ER. Pt aware, and will let us know if anything changes.

## 2019-01-18 ENCOUNTER — Other Ambulatory Visit: Payer: Self-pay

## 2019-01-18 ENCOUNTER — Emergency Department: Payer: BC Managed Care – PPO

## 2019-01-18 ENCOUNTER — Emergency Department
Admission: EM | Admit: 2019-01-18 | Discharge: 2019-01-18 | Disposition: A | Discharge status: Home / Self Care | Payer: BC Managed Care – PPO | Attending: Emergency Medicine | Admitting: Emergency Medicine

## 2019-01-18 DIAGNOSIS — O208 Other hemorrhage in early pregnancy: Secondary | ICD-10-CM | POA: Diagnosis not present

## 2019-01-18 DIAGNOSIS — O4690 Antepartum hemorrhage, unspecified, unspecified trimester: Secondary | ICD-10-CM

## 2019-01-18 DIAGNOSIS — O209 Hemorrhage in early pregnancy, unspecified: Secondary | ICD-10-CM | POA: Diagnosis not present

## 2019-01-18 DIAGNOSIS — Z3A01 Less than 8 weeks gestation of pregnancy: Secondary | ICD-10-CM | POA: Insufficient documentation

## 2019-01-18 DIAGNOSIS — Z3A00 Weeks of gestation of pregnancy not specified: Secondary | ICD-10-CM | POA: Diagnosis not present

## 2019-01-18 LAB — BASIC METABOLIC PANEL
Anion gap: 10 (ref 5–15)
BUN: 14 mg/dL (ref 6–20)
CO2: 23 mmol/L (ref 22–32)
Calcium: 9.5 mg/dL (ref 8.9–10.3)
Chloride: 106 mmol/L (ref 98–111)
Creatinine, Ser: 0.78 mg/dL (ref 0.44–1.00)
GFR calc Af Amer: >60 mL/min (ref >60)
GFR calc non Af Amer: >60 mL/min (ref >60)
Glucose, Bld: 107 mg/dL — ABNORMAL HIGH (ref 70–99)
Potassium: 3.8 mmol/L (ref 3.5–5.1)
Sodium: 139 mmol/L (ref 135–145)

## 2019-01-18 LAB — CBC
HCT: 41.3 % (ref 36.0–46.0)
Hemoglobin: 13.6 g/dL (ref 12.0–15.0)
MCH: 30.2 pg (ref 26.0–34.0)
MCHC: 32.9 g/dL (ref 30.0–36.0)
MCV: 91.8 fL (ref 80.0–100.0)
Platelets: 272 10*3/uL (ref 150–400)
RBC: 4.5 MIL/uL (ref 3.87–5.11)
RDW: 13.2 % (ref 11.5–15.5)
WBC: 6.8 10*3/uL (ref 4.0–10.5)
nRBC: 0 % (ref 0.0–0.2)

## 2019-01-18 LAB — TYPE AND SCREEN
ABO/RH(D): O POS
Antibody Screen: NEGATIVE

## 2019-01-18 LAB — HCG, QUANTITATIVE, PREGNANCY: hCG, Beta Chain, Quant, S: 4338 m[IU]/mL — ABNORMAL HIGH (ref <5)

## 2019-01-18 NOTE — ED Triage Notes (Signed)
Pt arrived via POV with reports of vaginal bleeding that started on Thursday but worse overnight.  Pt states she is about [redacted] weeks pregnant.  Pt is G-5 P-2  Pt reports some abdominal cramping with the bleeding. Pt states the bleeding is only happening when on the toilet.  Pt reports there are some clots.  Pt had an ultrasound on Tuesday and was told there might be a bleed and was due to follow up on Wednesday.  Pt goes to [place]

## 2019-01-18 NOTE — ED Provider Notes (Signed)
[facility] Emergency Department Provider Note   ____________________________________________    I have reviewed the triage vital signs and the nursing notes.   HISTORY  Chief Complaint Vaginal Bleeding     HPI Misty Carr is a 33 y.o. female who is [redacted] weeks pregnant who presents with complaints of vaginal bleeding.  Patient reports she had an ultrasound last week which was dating ultrasound, they noticed something unusual which they said could be bleeding.  She had some mild spotting which then progressed to bleeding with urination, her OB recommended evaluation.  She does see [facility].  She is a G5, P2.  Denies significant abdominal pain.  No fevers or chills.  No nausea or vomiting  Past Medical History:  Diagnosis Date  . 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 No known health problems   . Urinary tract infection     Patient Active Problem List   Diagnosis Date Noted  . BMI 31.0-31.9,adult 01/14/2019  . Supervision of high risk pregnancy, antepartum 01/03/2019  . Obesity affecting pregnancy, antepartum 01/03/2019  . Heterozygous factor V Leiden affecting pregnancy, antepartum ([place]) 06/19/2018    Past Surgical History:  Procedure Laterality Date  . CESAREAN SECTION  10/10/2011   Procedure: CESAREAN SECTION;  Surgeon: Margarette Asal, MD;  Location: [place] [facility];  Service: Gynecology;  Laterality: N/A;  . CESAREAN SECTION  04/10/2013   Placental previa    Prior to Admission medications   Medication Sig Start Date End Date Taking? Authorizing Provider  Cranberry 300 MG tablet Take 300 mg by mouth 2 (two) times daily.    [provider]  Prenatal Vit-DSS-Fe Cbn-FA (PRENATAL AD PO) Take by mouth.    [provider]     Allergies Penicillins  Family History  Problem Relation Age of Onset  . Colon cancer Maternal Grandmother 22  . Anesthesia problems Neg Hx     Social History Social History   Tobacco Use  . Smoking  status: Never Smoker  . Smokeless tobacco: Never Used  Substance Use Topics  . Alcohol use: No  . Drug use: No    Review of Systems  Constitutional: No fever/chills Eyes: No visual changes.  ENT: No sore throat. Cardiovascular: Denies chest pain. Respiratory: Denies shortness of breath. Gastrointestinal: As above Genitourinary: As above Musculoskeletal: Negative for back pain. Skin: Negative for rash. Neurological: Negative for headaches    ____________________________________________   PHYSICAL EXAM:  VITAL SIGNS: ED Triage Vitals [01/18/19 0746]  Enc Vitals Group     BP 125/73     Pulse Rate (!) 103     Resp 18     Temp 98.4 F (36.9 C)     Temp Source Oral     SpO2 100 %     Weight 83.9 kg (185 lb)     Height 1.626 m (5\' 4" )     Head Circumference      Peak Flow      Pain Score 0     Pain Loc      Pain Edu?      Excl. in [place]?     Constitutional: Alert and oriented.  Eyes: Conjunctivae are normal.  .Nose: No congestion/rhinnorhea. Mouth/Throat: Mucous membranes are moist.    Cardiovascular: Normal rate, regular rhythm.  Good peripheral circulation. Respiratory: Normal respiratory effort.  No retractions. Gastrointestinal: Soft and nontender. No distention.  No CVA tenderness. Genitourinary: deferred Musculoskeletal:   Warm and well perfused Neurologic:  Normal  speech and language. No gross focal neurologic deficits are appreciated.  Skin:  Skin is warm, dry and intact. No rash noted. Psychiatric: Mood and affect are normal. Speech and behavior are normal.  ____________________________________________   LABS (all labs ordered are listed, but only abnormal results are displayed)  Labs Reviewed  BASIC METABOLIC PANEL - Abnormal; Notable for the following components:      Result Value   Glucose, Bld 107 (*)    All other components within normal limits  HCG, QUANTITATIVE, PREGNANCY - Abnormal; Notable for the following components:   hCG, Beta Chain,  Quant, S 4,338 (*)    All other components within normal limits  CBC  TYPE AND SCREEN   ____________________________________________  EKG  None ____________________________________________  RADIOLOGY  Ultrasound ____________________________________________   PROCEDURES  Procedure(s) performed: No  Procedures   Critical Care performed: No ____________________________________________   INITIAL IMPRESSION / ASSESSMENT AND PLAN / ED COURSE  Pertinent labs & imaging results that were available during my care of the patient were reviewed by me and considered in my medical decision making (see chart for details).  Patient presents with vaginal bleeding as described above, will obtain hCG, ultrasound  hCG is increasing, ultrasound is unremarkable, patient is relieved, she will follow-up with [facility]    ____________________________________________   FINAL CLINICAL IMPRESSION(S) / ED DIAGNOSES  Final diagnoses:  Vaginal bleeding in pregnancy        Note:  This document was prepared using Dragon voice recognition software and may include unintentional dictation errors.   Jene EveryKinner, Aldina Porta, MD 01/18/19 1353

## 2019-01-20 ENCOUNTER — Telehealth: Payer: Self-pay

## 2019-01-20 DIAGNOSIS — F411 Generalized anxiety disorder: Secondary | ICD-10-CM | POA: Diagnosis not present

## 2019-01-20 NOTE — Telephone Encounter (Signed)
Pt called; 7wks; bleeding; went to ED Sat am; called after hour nurse Sat pm; on call was called and adv pt to do nothing strenuous, call office Monday, be seen for heavy bleeding.  In pt's msgs she states bleeding is marginally less than it was on Saturday.  Her question is: be seen today or okay to wait for appt on Wed?  [phone]  Pt states her pain is pretty much gone, bleeding is now like a very light period.  Reminded pt that u/s in ED showed heartbeat.  Since her bleeding is lighter and pain is much better adv to wait for appt on Wed which she is scheduled for u/s and appt c MD.

## 2019-01-21 ENCOUNTER — Encounter: Payer: Self-pay | Admitting: Emergency Medicine

## 2019-01-21 ENCOUNTER — Emergency Department: Payer: BC Managed Care – PPO

## 2019-01-21 ENCOUNTER — Other Ambulatory Visit: Payer: Self-pay

## 2019-01-21 ENCOUNTER — Emergency Department
Admission: EM | Admit: 2019-01-21 | Discharge: 2019-01-22 | Disposition: A | Discharge status: Home / Self Care | Payer: BC Managed Care – PPO | Attending: Emergency Medicine | Admitting: Emergency Medicine

## 2019-01-21 DIAGNOSIS — Z79899 Other long term (current) drug therapy: Secondary | ICD-10-CM | POA: Insufficient documentation

## 2019-01-21 DIAGNOSIS — O039 Complete or unspecified spontaneous abortion without complication: Secondary | ICD-10-CM | POA: Insufficient documentation

## 2019-01-21 DIAGNOSIS — O209 Hemorrhage in early pregnancy, unspecified: Secondary | ICD-10-CM | POA: Diagnosis not present

## 2019-01-21 DIAGNOSIS — Z3A00 Weeks of gestation of pregnancy not specified: Secondary | ICD-10-CM | POA: Diagnosis not present

## 2019-01-21 LAB — CBC WITH DIFFERENTIAL/PLATELET
Abs Immature Granulocytes: 0.03 10*3/uL (ref 0.00–0.07)
Basophils Absolute: 0.1 10*3/uL (ref 0.0–0.1)
Basophils Relative: 1 %
Eosinophils Absolute: 0.2 10*3/uL (ref 0.0–0.5)
Eosinophils Relative: 3 %
HCT: 36.8 % (ref 36.0–46.0)
Hemoglobin: 12 g/dL (ref 12.0–15.0)
Immature Granulocytes: 1 %
Lymphocytes Relative: 30 %
Lymphs Abs: 2 10*3/uL (ref 0.7–4.0)
MCH: 29.9 pg (ref 26.0–34.0)
MCHC: 32.6 g/dL (ref 30.0–36.0)
MCV: 91.8 fL (ref 80.0–100.0)
Monocytes Absolute: 0.5 10*3/uL (ref 0.1–1.0)
Monocytes Relative: 8 %
Neutro Abs: 3.9 10*3/uL (ref 1.7–7.7)
Neutrophils Relative %: 57 %
Platelets: 258 10*3/uL (ref 150–400)
RBC: 4.01 MIL/uL (ref 3.87–5.11)
RDW: 13.1 % (ref 11.5–15.5)
WBC: 6.7 10*3/uL (ref 4.0–10.5)
nRBC: 0 % (ref 0.0–0.2)

## 2019-01-21 LAB — HCG, QUANTITATIVE, PREGNANCY: hCG, Beta Chain, Quant, S: 916 m[IU]/mL — ABNORMAL HIGH (ref <5)

## 2019-01-21 NOTE — ED Triage Notes (Signed)
Patient to the ER with c/o vaginal bleeding. Patient states she is approx [redacted] weeks pregnant, states "something came out tonight".

## 2019-01-21 NOTE — ED Notes (Addendum)
Patient updated on wait time. Patient denies heavy bleeding at this time.

## 2019-01-21 NOTE — ED Notes (Addendum)
Discussed patient's case with Dr. Joni Fears. No ultrasound needed at this time emergently from waiting room. Patient to be evaluated by MD prior to any potential [place] order. Patient's hCG decreased from 8264 on 9/12 to 916 today.

## 2019-01-22 ENCOUNTER — Ambulatory Visit: Payer: BC Managed Care – PPO

## 2019-01-22 ENCOUNTER — Encounter: Payer: BC Managed Care – PPO | Admitting: Obstetrics & Gynecology

## 2019-01-22 DIAGNOSIS — O209 Hemorrhage in early pregnancy, unspecified: Secondary | ICD-10-CM | POA: Diagnosis not present

## 2019-01-22 DIAGNOSIS — Z3A00 Weeks of gestation of pregnancy not specified: Secondary | ICD-10-CM | POA: Diagnosis not present

## 2019-01-22 NOTE — ED Notes (Signed)
Vaginal bleeding and cramping today and states she had some heavy clots tonight

## 2019-01-22 NOTE — ED Provider Notes (Signed)
[facility] Emergency Department Provider Note  ____________________________________________  Time seen: Approximately 12:35 AM  I have reviewed the triage vital signs and the nursing notes.   HISTORY  Chief Complaint Vaginal Bleeding   HPI Misty Carr is a 33 y.o. female presents for evaluation of vaginal bleeding.  This is patient's fifth pregnancy.  The first 2 resulted in full-term infants.  The last 2 resulted in spontaneous miscarriages.  She started having vaginal bleeding 5 days ago and cramping.  This afternoon she passed the gestational sac.  Right now she is complaining of minimal bleeding, no abdominal pain, no dizziness, no chest pain, no shortness of breath.  Patient was seen here 2 days ago for the same and had a ultrasound showing a 7-week IUP.   Past Medical History:  Diagnosis Date   Factor V Leiden carrier ([facility])    No known health problems    Urinary tract infection     Patient Active Problem List   Diagnosis Date Noted   BMI 31.0-31.9,adult 01/14/2019   Supervision of high risk pregnancy, antepartum 01/03/2019   Obesity affecting pregnancy, antepartum 01/03/2019   Heterozygous factor V Leiden affecting pregnancy, antepartum ([facility]) 06/19/2018    Past Surgical History:  Procedure Laterality Date   CESAREAN SECTION  10/10/2011   Procedure: CESAREAN SECTION;  Surgeon: Meriel Picaichard M Holland, MD;  Location: [facility];  Service: Gynecology;  Laterality: N/A;   CESAREAN SECTION  04/10/2013   Placental previa    Prior to Admission medications   Medication Sig Start Date End Date Taking? Authorizing Provider  Cranberry 300 MG tablet Take 300 mg by mouth 2 (two) times daily.    [provider]  Prenatal Vit-DSS-Fe Cbn-FA (PRENATAL AD PO) Take by mouth.    [provider]    Allergies Penicillins  Family History  Problem Relation Age of Onset   Colon cancer Maternal Grandmother [id number]   Anesthesia problems Neg  Hx     Social History Social History   Tobacco Use   Smoking status: Never Smoker   Smokeless tobacco: Never Used  Substance Use Topics   Alcohol use: No   Drug use: No    Review of Systems  Constitutional: Negative for fever. Eyes: Negative for visual changes. ENT: Negative for sore throat. Neck: No neck pain  Cardiovascular: Negative for chest pain. Respiratory: Negative for shortness of breath. Gastrointestinal: Negative for abdominal pain, vomiting or diarrhea. Genitourinary: Negative for dysuria. + vaginal bleeding Musculoskeletal: Negative for back pain. Skin: Negative for rash. Neurological: Negative for headaches, weakness or numbness. Psych: No SI or HI  ____________________________________________   PHYSICAL EXAM:  VITAL SIGNS: ED Triage Vitals  Enc Vitals Group     BP 01/21/19 1948 112/66     Pulse Rate 01/21/19 1948 82     Resp 01/21/19 1948 16     Temp 01/21/19 1948 98.8 F (37.1 C)     Temp Source 01/21/19 1948 Oral     SpO2 01/21/19 1948 98 %     Weight 01/21/19 1916 185 lb (83.9 kg)     Height 01/21/19 1916 5\' 4"  (1.626 m)     Head Circumference --      Peak Flow --      Pain Score --      Pain Loc --      Pain Edu? --      Excl. in [place]? --     Constitutional: Alert and oriented. Well appearing and in  no apparent distress. HEENT:      Head: Normocephalic and atraumatic.         Eyes: Conjunctivae are normal. Sclera is non-icteric.       Mouth/Throat: Mucous membranes are moist.       Neck: Supple with no signs of meningismus. Cardiovascular: Regular rate and rhythm. No murmurs, gallops, or rubs. 2+ symmetrical distal pulses are present in all extremities. No JVD. Respiratory: Normal respiratory effort. Lungs are clear to auscultation bilaterally. No wheezes, crackles, or rhonchi.  Gastrointestinal: Soft, non tender, and non distended with positive bowel sounds. No rebound or guarding. Musculoskeletal: Nontender with normal range of  motion in all extremities. No edema, cyanosis, or erythema of extremities. Neurologic: Normal speech and language. Face is symmetric. Moving all extremities. No gross focal neurologic deficits are appreciated. Skin: Skin is warm, dry and intact. No rash noted. Psychiatric: Mood and affect are normal. Speech and behavior are normal.  ____________________________________________   LABS (all labs ordered are listed, but only abnormal results are displayed)  Labs Reviewed  HCG, QUANTITATIVE, PREGNANCY - Abnormal; Notable for the following components:      Result Value   hCG, Beta Chain, Quant, S 916 (*)    All other components within normal limits  CBC WITH DIFFERENTIAL/PLATELET  SURGICAL PATHOLOGY   ____________________________________________  EKG  none  ____________________________________________  RADIOLOGY  I have personally reviewed the images performed during this visit and I agree with the Radiologist's read.   Interpretation by Radiologist:  US Ob Comp Less 14 Wks  Result Date: 01/22/2019 CLINICAL DATA:  33 year old female with vaginal bleeding for 6 days in the 1st trimester of pregnancy. Decreasing quantitative beta HCG. LMP 11/22/2018. EXAM: OBSTETRIC <14 WK [place] AND TRANSVAGINAL OB US TECHNIQUE: Both transabdominal and transvaginal ultrasound examinations were performed for complete evaluation of the gestation as well as the maternal uterus, adnexal regions, and pelvic cul-de-sac. Transvaginal technique was performed to assess early pregnancy. COMPARISON:  01/18/2019. FINDINGS: Intrauterine gestational sac: None visible today. There is a mildly heterogeneous appearance of the endometrium in the lower uterine segment on image 54. Bland endometrial thickening measuring up to 9 millimeters elsewhere. Maternal uterus/adnexae: The uterus measures 9.7 x 4.5 x 6.5 centimeters with a volume of 151 milliliters. The right ovary measures 3.1 x 2.5 x 3.2 centimeters with a volume of 13  milliliters. There is a 15 millimeter cyst with no internal vascular elements and no significant surrounding hypervascularity (image 73). The left ovary is smaller measuring 2.0 x 1.5 x 1.6 centimeters with a volume of 3 milliliters. Bland appearing left ovary. No pelvic free fluid. IMPRESSION: 1. Absence of the small intrauterine gestational sac seen on 01/18/2019, compatible with interval spontaneous abortion. 2. No pelvic free fluid. Negative appearance of the uterus and ovaries today. Electronically Signed   By: Genevie Ann M.D.   On: 01/22/2019 00:30   US Ob Transvaginal  Result Date: 01/22/2019 CLINICAL DATA:  33 year old female with vaginal bleeding for 6 days in the 1st trimester of pregnancy. Decreasing quantitative beta HCG. LMP 11/22/2018. EXAM: OBSTETRIC <14 WK [place] AND TRANSVAGINAL OB US TECHNIQUE: Both transabdominal and transvaginal ultrasound examinations were performed for complete evaluation of the gestation as well as the maternal uterus, adnexal regions, and pelvic cul-de-sac. Transvaginal technique was performed to assess early pregnancy. COMPARISON:  01/18/2019. FINDINGS: Intrauterine gestational sac: None visible today. There is a mildly heterogeneous appearance of the endometrium in the lower uterine segment on image 54. Bland endometrial thickening measuring up to  9 millimeters elsewhere. Maternal uterus/adnexae: The uterus measures 9.7 x 4.5 x 6.5 centimeters with a volume of 151 milliliters. The right ovary measures 3.1 x 2.5 x 3.2 centimeters with a volume of 13 milliliters. There is a 15 millimeter cyst with no internal vascular elements and no significant surrounding hypervascularity (image 73). The left ovary is smaller measuring 2.0 x 1.5 x 1.6 centimeters with a volume of 3 milliliters. Bland appearing left ovary. No pelvic free fluid. IMPRESSION: 1. Absence of the small intrauterine gestational sac seen on 01/18/2019, compatible with interval spontaneous abortion. 2. No pelvic free  fluid. Negative appearance of the uterus and ovaries today. Electronically Signed   By: Odessa Fleming M.D.   On: 01/22/2019 00:30     ____________________________________________   PROCEDURES  Procedure(s) performed: None Procedures Critical Care performed:  None ____________________________________________   INITIAL IMPRESSION / ASSESSMENT AND PLAN / ED COURSE   33 y.o. female presents for evaluation of vaginal bleeding.  Presentation concerning for miscarriage.  Repeat ultrasound today shows absence of IUP that was seen 3 days ago.  Patient is hemodynamically stable with no significant bleeding at this time, stable hemoglobin.  Blood type O+ no indication for RhoGam.  Patient did bring with her the products of conception that she passed this afternoon.  She has requested that it be sent to pathology since this is her third miscarriage.  We sent it to pathology and requested that the results be forwarded to patient's OB/GYN Dr. Jean Rosenthal.  Discussed supportive care at home and follow-up with her OB/GYN.  Discussed my standard return precautions for any signs of acute blood loss anemia.       As part of my medical decision making, I reviewed the following data within the electronic MEDICAL RECORD NUMBER Nursing notes reviewed and incorporated, Labs reviewed , Old chart reviewed, Radiograph reviewed , Notes from prior ED visits and [place] Controlled Substance Database   Patient was evaluated in Emergency Department today for the symptoms described in the history of present illness. Patient was evaluated in the context of the global COVID-19 pandemic, which necessitated consideration that the patient might be at risk for infection with the SARS-CoV-2 virus that causes COVID-19. Institutional protocols and algorithms that pertain to the evaluation of patients at risk for COVID-19 are in a state of rapid change based on information released by regulatory bodies including the CDC and federal and state  organizations. These policies and algorithms were followed during the patient's care in the ED.   ____________________________________________   FINAL CLINICAL IMPRESSION(S) / ED DIAGNOSES   Final diagnoses:  Miscarriage      NEW MEDICATIONS STARTED DURING THIS VISIT:  ED Discharge Orders    None       Note:  This document was prepared using Dragon voice recognition software and may include unintentional dictation errors.    Don Perking, [place], MD 01/22/19 [phone]

## 2019-01-28 ENCOUNTER — Other Ambulatory Visit: Payer: Self-pay

## 2019-01-28 ENCOUNTER — Encounter: Payer: Self-pay | Admitting: Obstetrics and Gynecology

## 2019-01-28 ENCOUNTER — Ambulatory Visit (INDEPENDENT_AMBULATORY_CARE_PROVIDER_SITE_OTHER): Payer: BC Managed Care – PPO | Admitting: Obstetrics and Gynecology

## 2019-01-28 VITALS — BP 118/74 | Ht 64.0 in | Wt 188.0 lb

## 2019-01-28 DIAGNOSIS — O039 Complete or unspecified spontaneous abortion without complication: Secondary | ICD-10-CM

## 2019-01-28 NOTE — Progress Notes (Signed)
Obstetrics & Gynecology Office Visit   Chief Complaint  Patient presents with  . Follow-up   History of Present Illness: 33 y.o. [phone] female who presents in follow up from a miscarriage about 1 week ago. She brought her POC to the ER, the pathology did show chorionic villi, decidua, and fetal tissue. No evidence of GTD.  Minimal bleeding. No pain at this point.     Past Medical History:  Diagnosis Date  . Factor V Leiden carrier (HCC)   . No known health problems   . Urinary tract infection     Past Surgical History:  Procedure Laterality Date  . CESAREAN SECTION  10/10/2011   Procedure: CESAREAN SECTION;  Surgeon: Meriel Pica, MD;  Location: [facility];  Service: Gynecology;  Laterality: N/A;  . CESAREAN SECTION  04/10/2013   Placental previa    Gynecologic History: Patient's last menstrual period was 11/22/2018 (exact date).  Obstetric History: [id number]  Family History  Problem Relation Age of Onset  . Colon cancer Maternal Grandmother 69  . Anesthesia problems Neg Hx     Social History   Socioeconomic History  . Marital status: Married    Spouse name: Not on file  . Number of children: Not on file  . Years of education: Not on file  . Highest education level: Not on file  Occupational History  . Not on file  Social Needs  . Financial resource strain: Not on file  . Food insecurity    Worry: Not on file    Inability: Not on file  . Transportation needs    Medical: Not on file    Non-medical: Not on file  Tobacco Use  . Smoking status: Never Smoker  . Smokeless tobacco: Never Used  Substance and Sexual Activity  . Alcohol use: No  . Drug use: No  . Sexual activity: Yes    Birth control/protection: None  Lifestyle  . Physical activity    Days per week: Not on file    Minutes per session: Not on file  . Stress: Not on file  Relationships  . Social Musician on phone: Not on file    Gets together: Not on file    Attends religious  service: Not on file    Active member of club or organization: Not on file    Attends meetings of clubs or organizations: Not on file    Relationship status: Not on file  . Intimate partner violence    Fear of current or ex partner: Not on file    Emotionally abused: Not on file    Physically abused: Not on file    Forced sexual activity: Not on file  Other Topics Concern  . Not on file  Social History Narrative  . Not on file    Allergies  Allergen Reactions  . Penicillins Rash    ONLY WITH INJECTABLE PCN- PT CAN TAKE ORALLY    Prior to Admission medications   Medication Sig Start Date End Date Taking? Authorizing Provider  Cranberry 300 MG tablet Take 300 mg by mouth 2 (two) times daily.    [provider]  Prenatal Vit-DSS-Fe Cbn-FA (PRENATAL AD PO) Take by mouth.    [provider]    Review of Systems  Constitutional: Negative.   HENT: Negative.   Eyes: Negative.   Respiratory: Negative.   Cardiovascular: Negative.   Gastrointestinal: Negative.   Genitourinary: Negative.   Musculoskeletal: Negative.   Skin: Negative.  Neurological: Negative.   Psychiatric/Behavioral: Negative.      Physical Exam BP 118/74   Ht 5\' 4"  (1.626 m)   Wt 188 lb (85.3 kg)   LMP 11/22/2018 (Exact Date)   BMI 32.27 kg/m  Patient's last menstrual period was 11/22/2018 (exact date). Physical Exam Constitutional:      General: She is not in acute distress.    Appearance: Normal appearance.  HENT:     Head: Normocephalic and atraumatic.  Eyes:     General: No scleral icterus.    Conjunctiva/sclera: Conjunctivae normal.  Neurological:     General: No focal deficit present.     Mental Status: She is alert and oriented to person, place, and time.     Cranial Nerves: No cranial nerve deficit.  Psychiatric:        Mood and Affect: Mood normal.        Behavior: Behavior normal.        Judgment: Judgment normal.     Female chaperone present for pelvic and breast   portions of the physical exam  Assessment: 33 y.o. [id number] female here for  1. Miscarriage      Plan: Problem List Items Addressed This Visit    None    Visit Diagnoses    Miscarriage    -  Primary     Discussed miscarriage.  No current symptoms. Pathology consistent.  Recheck UPT in 2-3 weeks. If positive, let me know. Will check hCG quant. Discussed contraception. She will consider.  15 minutes spent in face to face discussion with > 50% spent in counseling,management, and coordination of care of her miscarriage.   Prentice Docker, MD 01/28/2019 3:02 PM

## 2019-02-03 DIAGNOSIS — F411 Generalized anxiety disorder: Secondary | ICD-10-CM | POA: Diagnosis not present

## 2019-02-07 LAB — SURGICAL PATHOLOGY

## 2019-02-17 DIAGNOSIS — F411 Generalized anxiety disorder: Secondary | ICD-10-CM | POA: Diagnosis not present

## 2019-03-03 DIAGNOSIS — F411 Generalized anxiety disorder: Secondary | ICD-10-CM | POA: Diagnosis not present

## 2019-03-17 DIAGNOSIS — F411 Generalized anxiety disorder: Secondary | ICD-10-CM | POA: Diagnosis not present

## 2019-03-31 DIAGNOSIS — F411 Generalized anxiety disorder: Secondary | ICD-10-CM | POA: Diagnosis not present

## 2019-04-14 DIAGNOSIS — F411 Generalized anxiety disorder: Secondary | ICD-10-CM | POA: Diagnosis not present

## 2019-04-28 DIAGNOSIS — F411 Generalized anxiety disorder: Secondary | ICD-10-CM | POA: Diagnosis not present

## 2019-05-12 DIAGNOSIS — F411 Generalized anxiety disorder: Secondary | ICD-10-CM | POA: Diagnosis not present

## 2019-05-26 DIAGNOSIS — F411 Generalized anxiety disorder: Secondary | ICD-10-CM | POA: Diagnosis not present

## 2019-06-09 DIAGNOSIS — F411 Generalized anxiety disorder: Secondary | ICD-10-CM | POA: Diagnosis not present

## 2019-06-16 DIAGNOSIS — E669 Obesity, unspecified: Secondary | ICD-10-CM | POA: Diagnosis not present

## 2019-06-16 DIAGNOSIS — Z0000 Encounter for general adult medical examination without abnormal findings: Secondary | ICD-10-CM | POA: Diagnosis not present

## 2019-06-23 DIAGNOSIS — F411 Generalized anxiety disorder: Secondary | ICD-10-CM | POA: Diagnosis not present

## 2019-07-07 DIAGNOSIS — F411 Generalized anxiety disorder: Secondary | ICD-10-CM | POA: Diagnosis not present

## 2019-07-21 DIAGNOSIS — F411 Generalized anxiety disorder: Secondary | ICD-10-CM | POA: Diagnosis not present

## 2019-07-30 DIAGNOSIS — Z23 Encounter for immunization: Secondary | ICD-10-CM | POA: Diagnosis not present

## 2019-08-04 DIAGNOSIS — F411 Generalized anxiety disorder: Secondary | ICD-10-CM | POA: Diagnosis not present

## 2019-08-18 DIAGNOSIS — F411 Generalized anxiety disorder: Secondary | ICD-10-CM | POA: Diagnosis not present

## 2019-08-20 DIAGNOSIS — Z23 Encounter for immunization: Secondary | ICD-10-CM | POA: Diagnosis not present

## 2019-09-01 DIAGNOSIS — F411 Generalized anxiety disorder: Secondary | ICD-10-CM | POA: Diagnosis not present

## 2019-09-15 DIAGNOSIS — F411 Generalized anxiety disorder: Secondary | ICD-10-CM | POA: Diagnosis not present

## 2019-09-29 DIAGNOSIS — F411 Generalized anxiety disorder: Secondary | ICD-10-CM | POA: Diagnosis not present

## 2019-10-13 DIAGNOSIS — F411 Generalized anxiety disorder: Secondary | ICD-10-CM | POA: Diagnosis not present

## 2019-10-27 DIAGNOSIS — F411 Generalized anxiety disorder: Secondary | ICD-10-CM | POA: Diagnosis not present

## 2019-11-10 DIAGNOSIS — F411 Generalized anxiety disorder: Secondary | ICD-10-CM | POA: Diagnosis not present

## 2019-12-01 DIAGNOSIS — F411 Generalized anxiety disorder: Secondary | ICD-10-CM | POA: Diagnosis not present

## 2019-12-22 DIAGNOSIS — F411 Generalized anxiety disorder: Secondary | ICD-10-CM | POA: Diagnosis not present

## 2020-01-05 DIAGNOSIS — F411 Generalized anxiety disorder: Secondary | ICD-10-CM | POA: Diagnosis not present

## 2020-02-02 DIAGNOSIS — F411 Generalized anxiety disorder: Secondary | ICD-10-CM | POA: Diagnosis not present

## 2020-02-23 DIAGNOSIS — F411 Generalized anxiety disorder: Secondary | ICD-10-CM | POA: Diagnosis not present

## 2020-03-10 DIAGNOSIS — Z3202 Encounter for pregnancy test, result negative: Secondary | ICD-10-CM | POA: Diagnosis not present

## 2020-03-10 DIAGNOSIS — N3000 Acute cystitis without hematuria: Secondary | ICD-10-CM | POA: Diagnosis not present

## 2020-03-22 DIAGNOSIS — F411 Generalized anxiety disorder: Secondary | ICD-10-CM | POA: Diagnosis not present

## 2020-04-12 DIAGNOSIS — F411 Generalized anxiety disorder: Secondary | ICD-10-CM | POA: Diagnosis not present

## 2020-05-10 DIAGNOSIS — F411 Generalized anxiety disorder: Secondary | ICD-10-CM | POA: Diagnosis not present

## 2020-05-31 DIAGNOSIS — F411 Generalized anxiety disorder: Secondary | ICD-10-CM | POA: Diagnosis not present

## 2020-06-21 DIAGNOSIS — F411 Generalized anxiety disorder: Secondary | ICD-10-CM | POA: Diagnosis not present

## 2020-07-09 DIAGNOSIS — Z03818 Encounter for observation for suspected exposure to other biological agents ruled out: Secondary | ICD-10-CM | POA: Diagnosis not present

## 2020-07-19 DIAGNOSIS — F411 Generalized anxiety disorder: Secondary | ICD-10-CM | POA: Diagnosis not present

## 2020-08-09 DIAGNOSIS — F411 Generalized anxiety disorder: Secondary | ICD-10-CM | POA: Diagnosis not present

## 2020-09-06 DIAGNOSIS — F411 Generalized anxiety disorder: Secondary | ICD-10-CM | POA: Diagnosis not present

## 2020-09-16 DIAGNOSIS — Z0000 Encounter for general adult medical examination without abnormal findings: Secondary | ICD-10-CM | POA: Diagnosis not present

## 2020-10-04 DIAGNOSIS — F411 Generalized anxiety disorder: Secondary | ICD-10-CM | POA: Diagnosis not present

## 2020-11-01 DIAGNOSIS — F411 Generalized anxiety disorder: Secondary | ICD-10-CM | POA: Diagnosis not present

## 2020-12-06 DIAGNOSIS — F411 Generalized anxiety disorder: Secondary | ICD-10-CM | POA: Diagnosis not present

## 2021-01-03 DIAGNOSIS — F411 Generalized anxiety disorder: Secondary | ICD-10-CM | POA: Diagnosis not present

## 2021-01-31 DIAGNOSIS — F411 Generalized anxiety disorder: Secondary | ICD-10-CM | POA: Diagnosis not present

## 2021-02-28 DIAGNOSIS — F411 Generalized anxiety disorder: Secondary | ICD-10-CM | POA: Diagnosis not present

## 2021-03-28 DIAGNOSIS — F411 Generalized anxiety disorder: Secondary | ICD-10-CM | POA: Diagnosis not present

## 2021-04-27 DIAGNOSIS — F411 Generalized anxiety disorder: Secondary | ICD-10-CM | POA: Diagnosis not present

## 2021-05-05 DIAGNOSIS — N390 Urinary tract infection, site not specified: Secondary | ICD-10-CM | POA: Diagnosis not present

## 2021-06-01 IMAGING — US US OB COMP LESS 14 WK
1 series · 14 of 28 positions shown · non-contrast
Comparison: 01/14/2019

CLINICAL DATA: Vaginal bleeding. Quantitative HCG [DATE]. Estimated
gestational age per LMP 8 weeks 1 day.

EXAM:
OBSTETRIC <14 WK US AND TRANSVAGINAL OB US
TECHNIQUE: Both transabdominal and transvaginal ultrasound examinations were
performed for complete evaluation of the gestation as well as the
maternal uterus, adnexal regions, and pelvic cul-de-sac.
Transvaginal technique was performed to assess early pregnancy.

[Series 1: us ob comp less 14 wk · 14 of 56 slices shown]
[im 3/56]
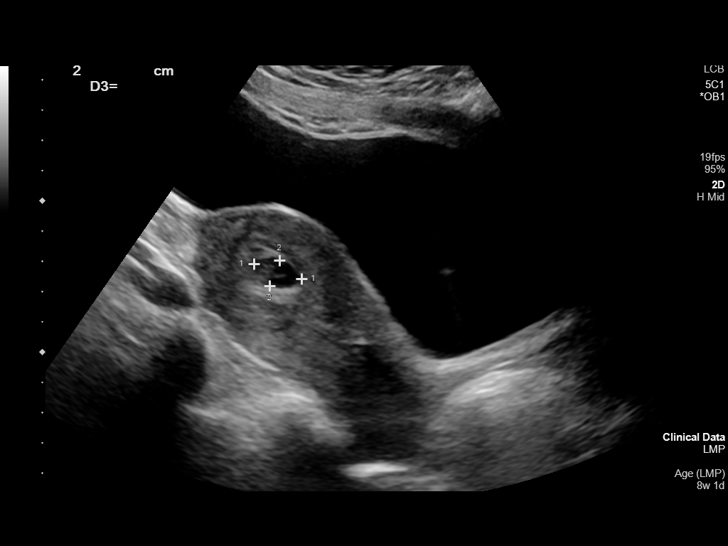
[im 7/56]
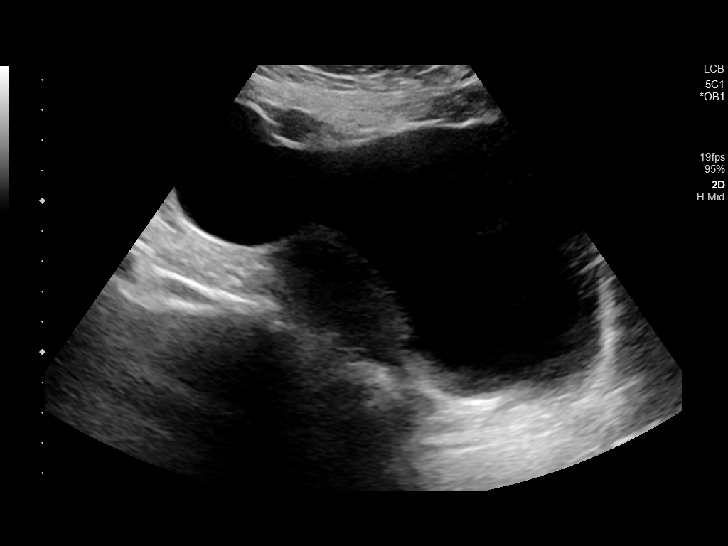
[im 11/56]
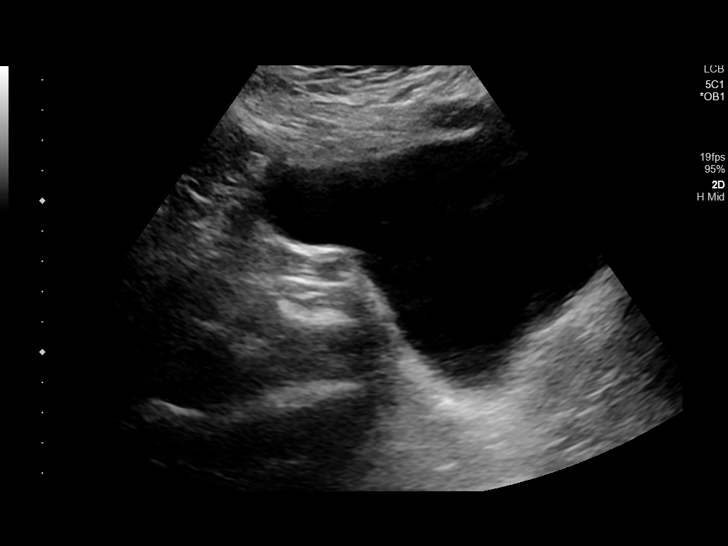
[im 15/56]
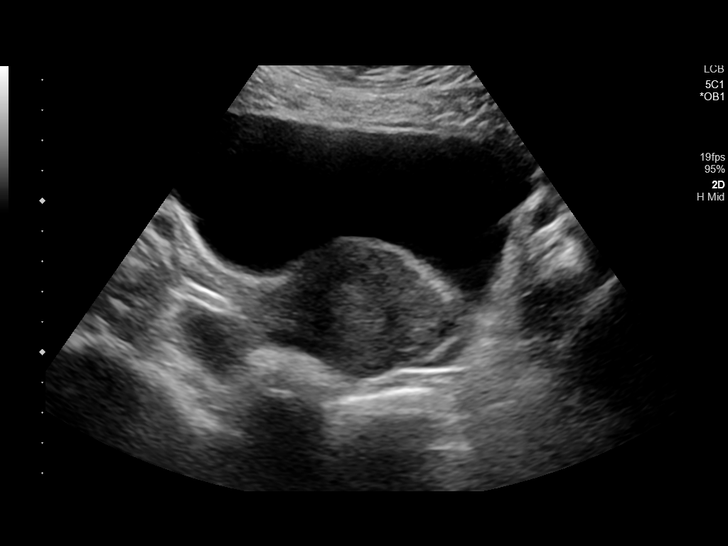
[im 19/56]
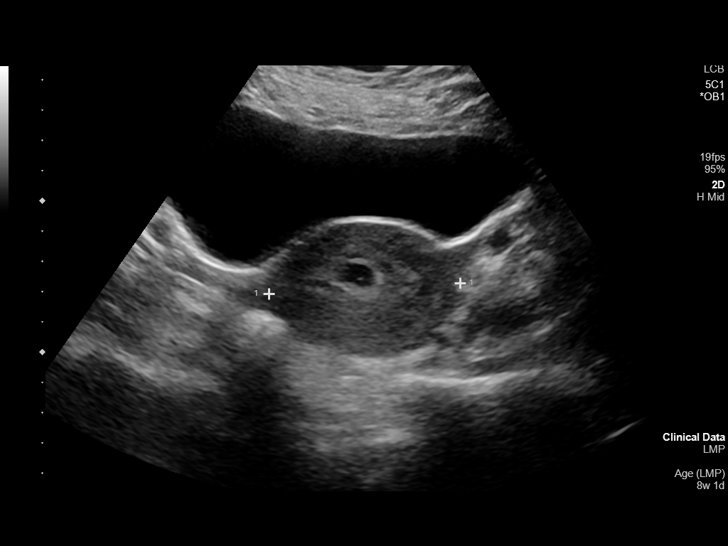
[im 23/56]
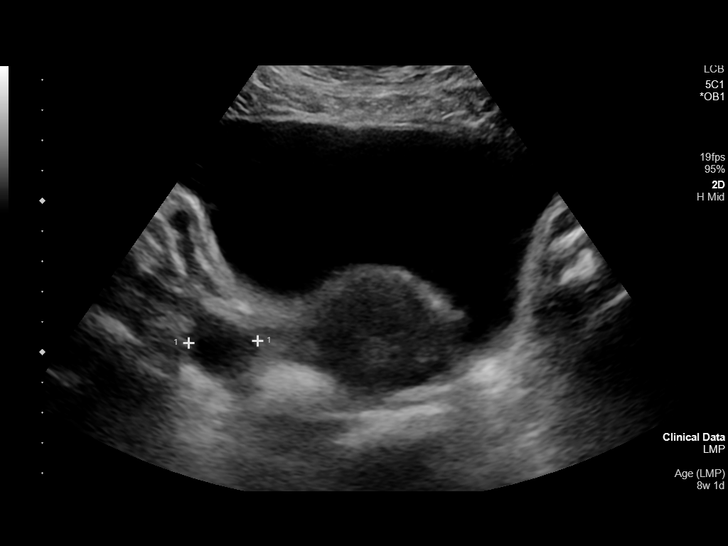
[im 27/56]
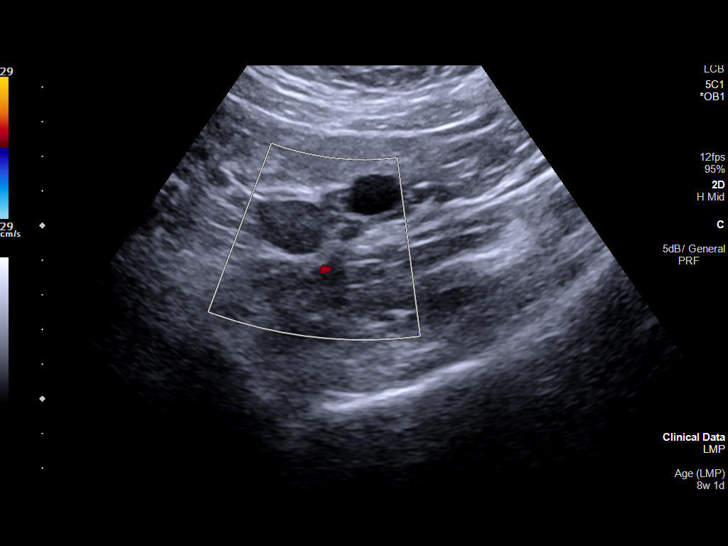
[im 31/56]
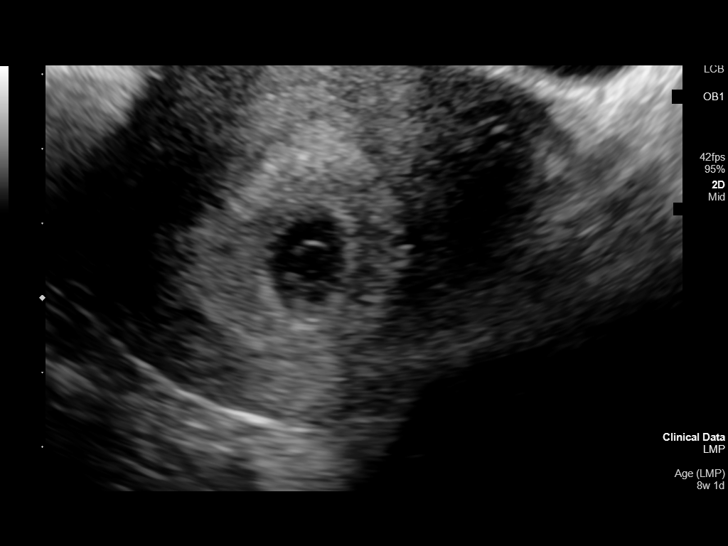
[im 35/56]
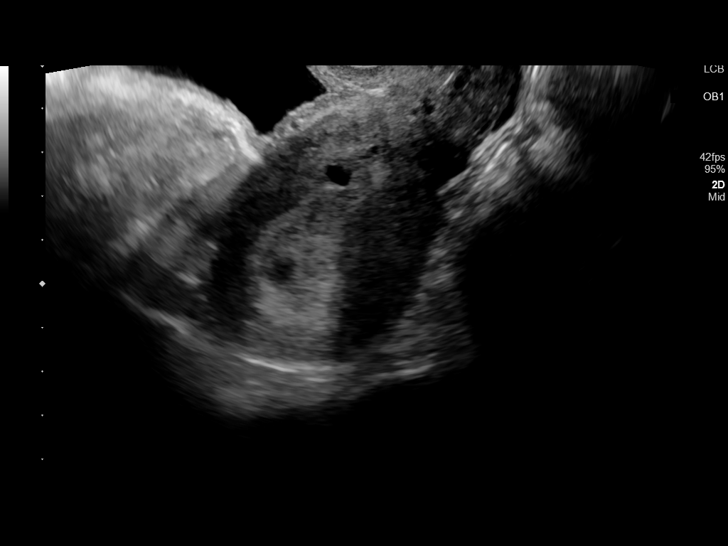
[im 39/56]
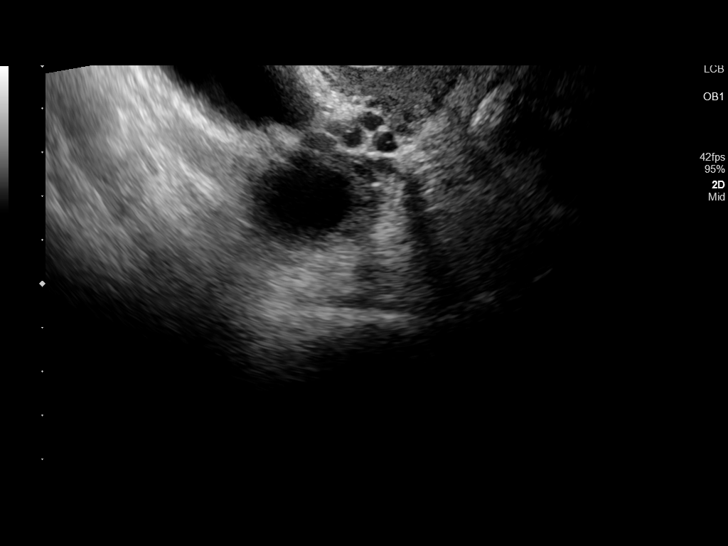
[im 43/56]
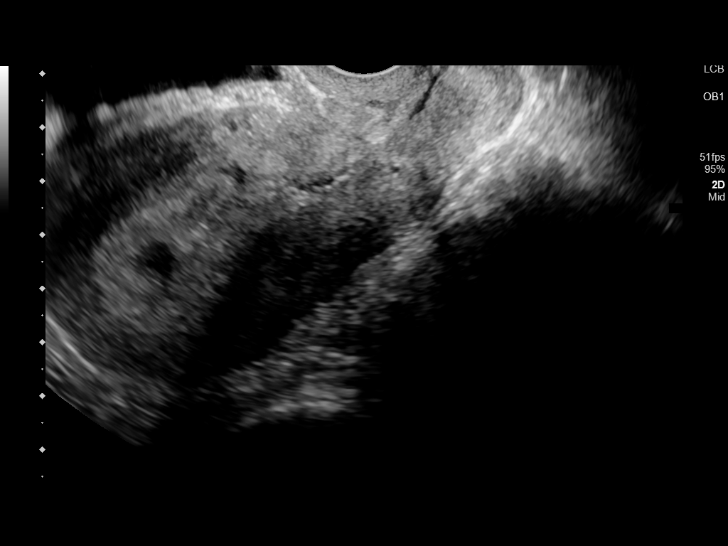
[im 47/56]
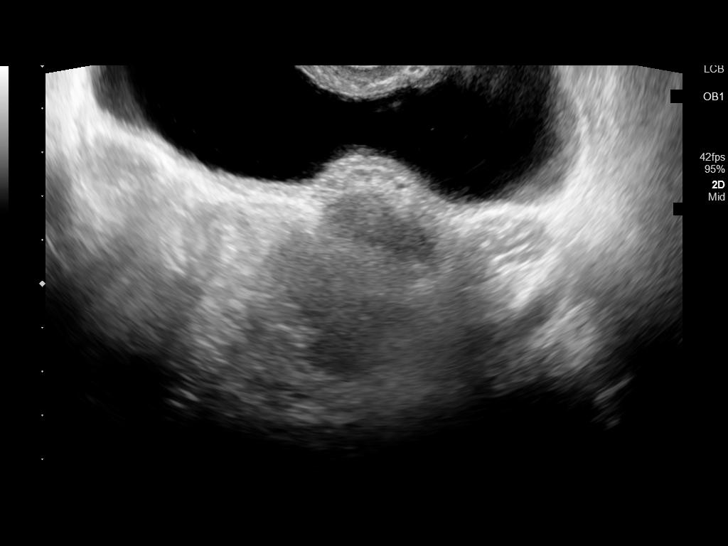
[im 51/56]
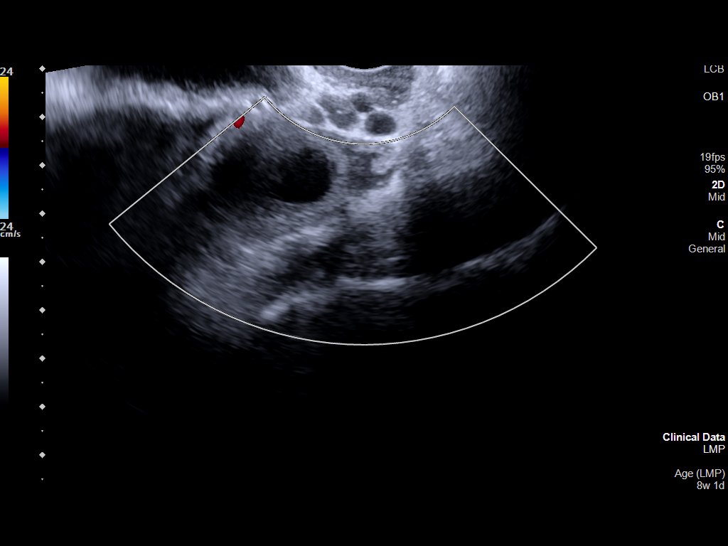
[im 56/56]
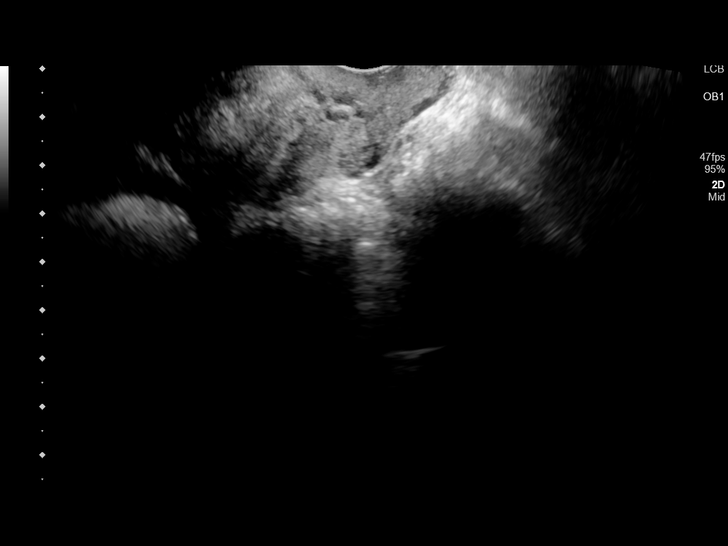

[14 of 28 positions shown; findings below may reference images not displayed]

FINDINGS: Intrauterine gestational sac: Single visualized.

Yolk sac:  Visualized.

Embryo:  Visualized.

Cardiac Activity: Visualized.

Heart Rate: 135 bpm

CRL: 7.1 mm   6 w   4 d                  US EDC: 09/11/2019

Subchorionic hemorrhage:  None visualized.

Maternal uterus/adnexae: Ovaries normal size, shape position. No
free pelvic fluid.
IMPRESSION: Single live IUP with estimated gestational age 6 weeks 4 days.

## 2021-06-04 IMAGING — US US OB COMP LESS 14 WK
1 series · 13 of 28 positions shown · non-contrast
Comparison: 01/18/2019.

CLINICAL DATA: 33-year-old female with vaginal bleeding for 6 days
in the 1st trimester of pregnancy. Decreasing quantitative beta HCG.
LMP 11/22/2018.

EXAM:
OBSTETRIC <14 WK US AND TRANSVAGINAL OB US
TECHNIQUE: Both transabdominal and transvaginal ultrasound examinations were
performed for complete evaluation of the gestation as well as the
maternal uterus, adnexal regions, and pelvic cul-de-sac.
Transvaginal technique was performed to assess early pregnancy.

[Series 1: us ob comp less 14 wk · 13 of 87 slices shown]
[im 4/87]
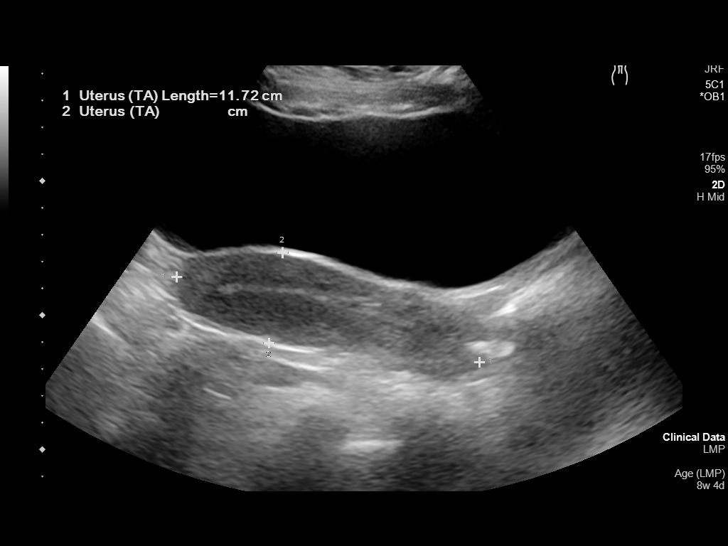
[im 10/87]
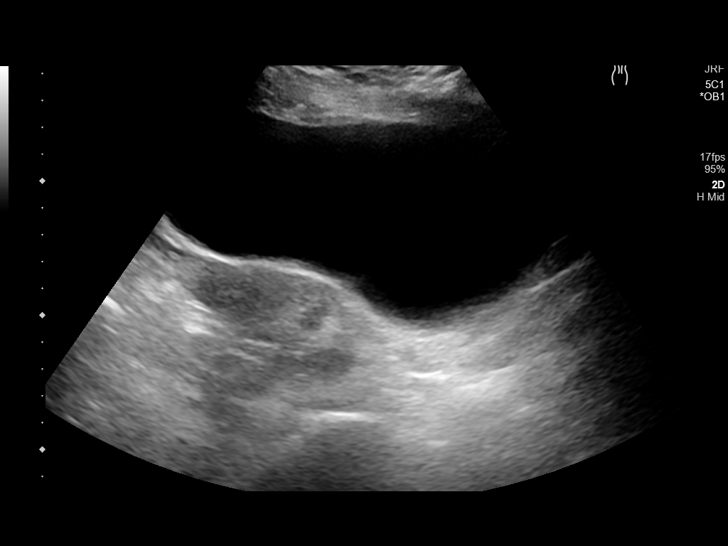
[im 16/87]
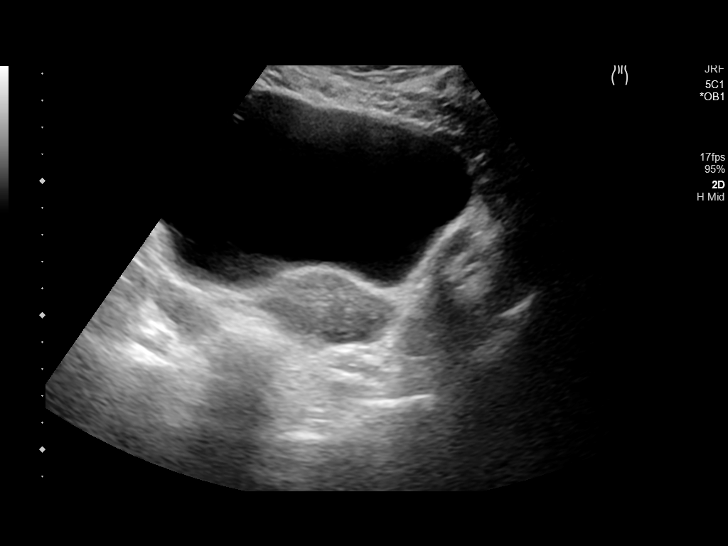
[im 23/87]
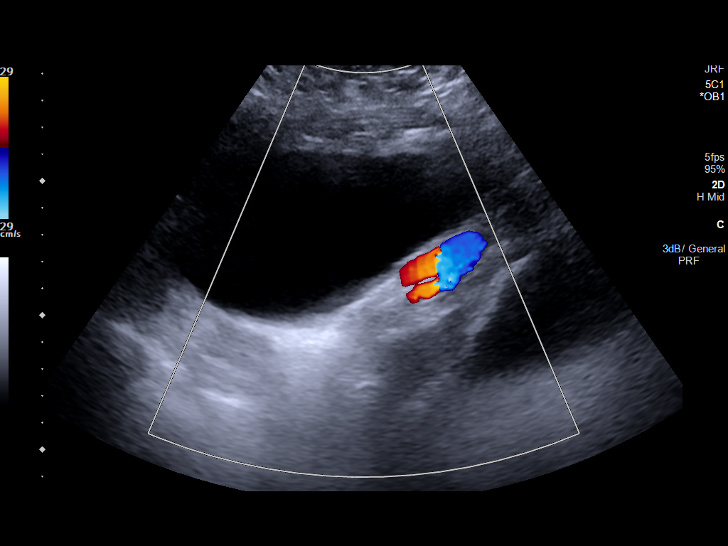
[im 29/87]
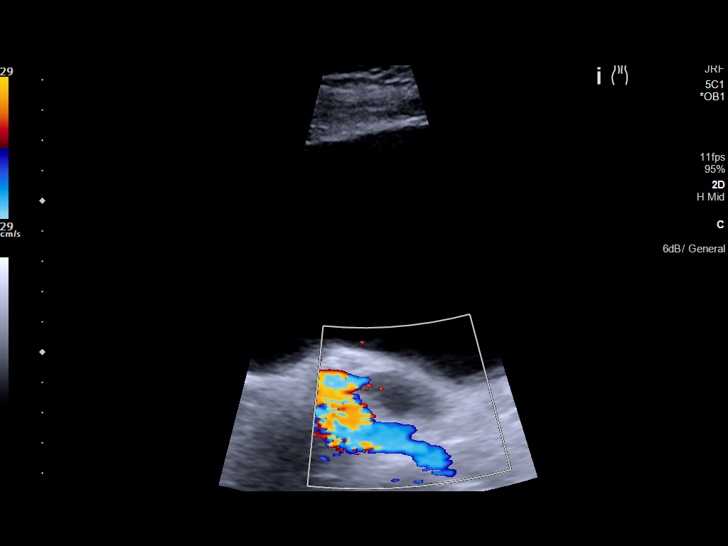
[im 36/87]
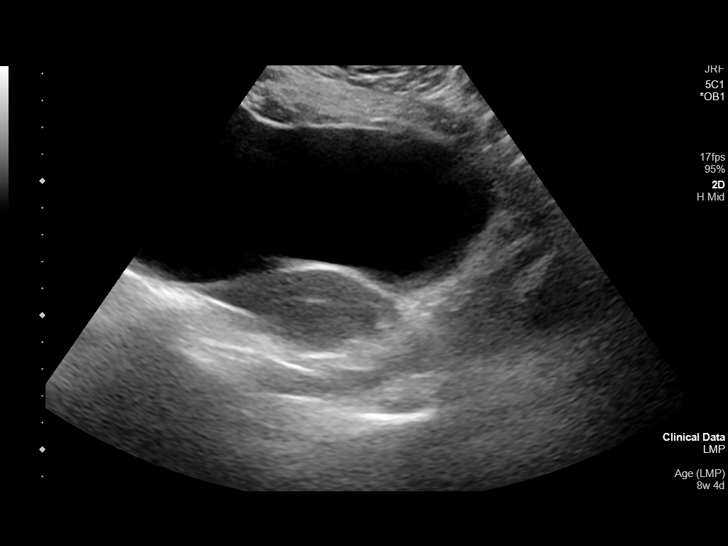
[im 45/87]
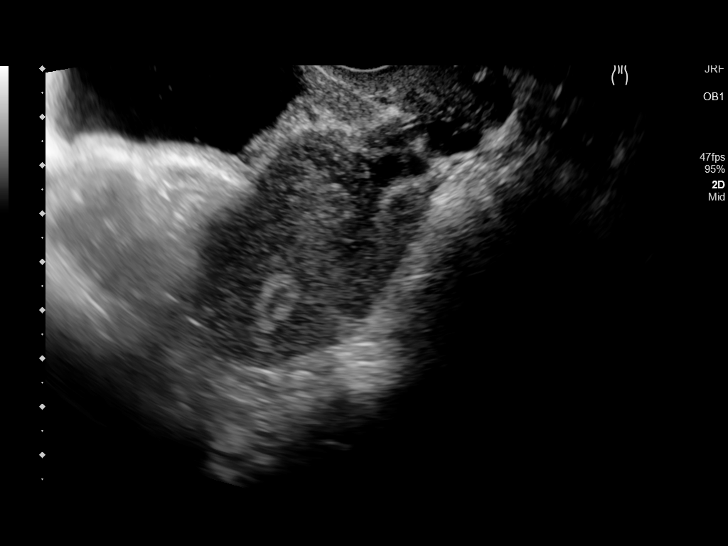
[im 51/87]
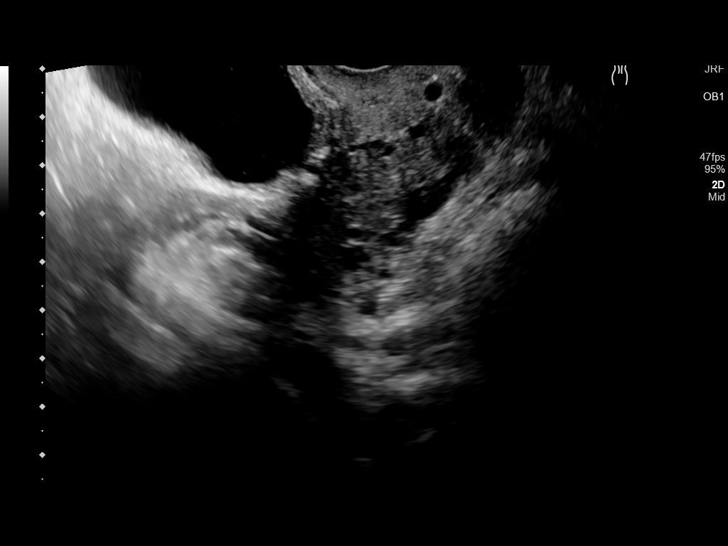
[im 58/87]
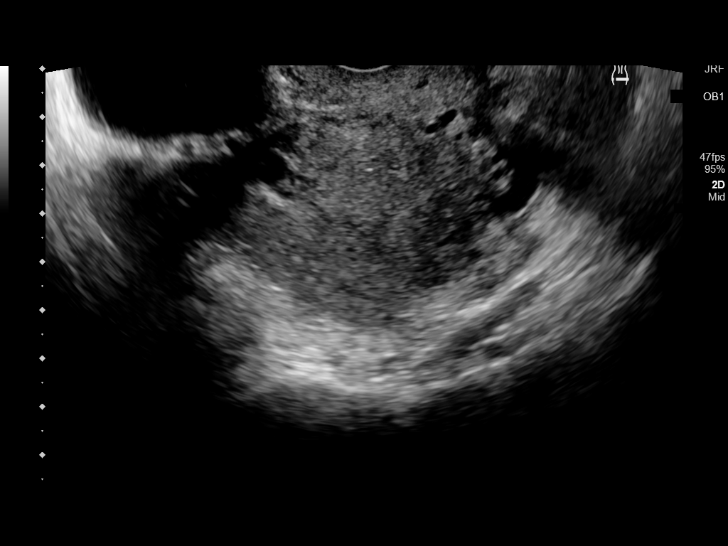
[im 64/87]
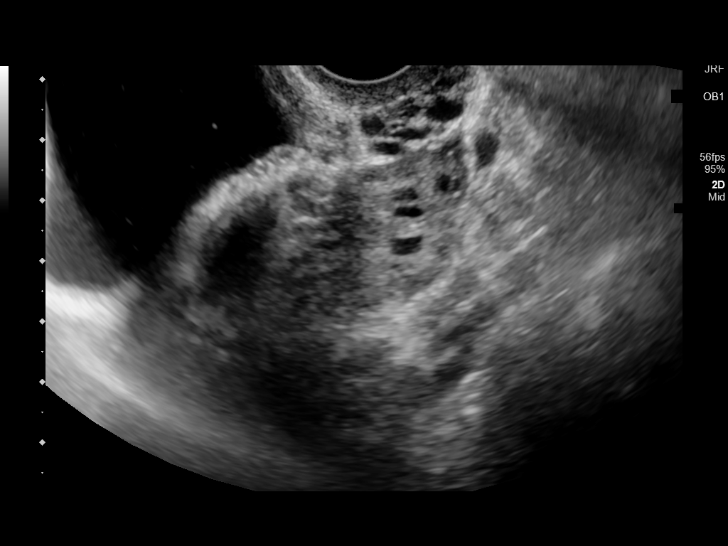
[im 71/87]
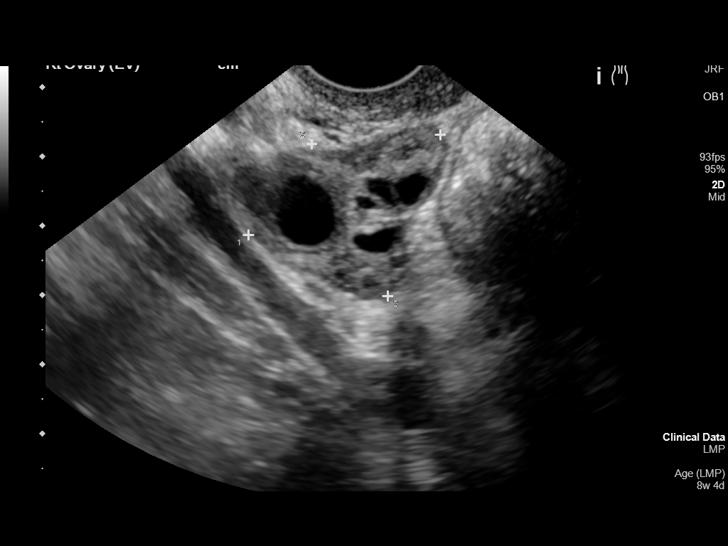
[im 77/87]
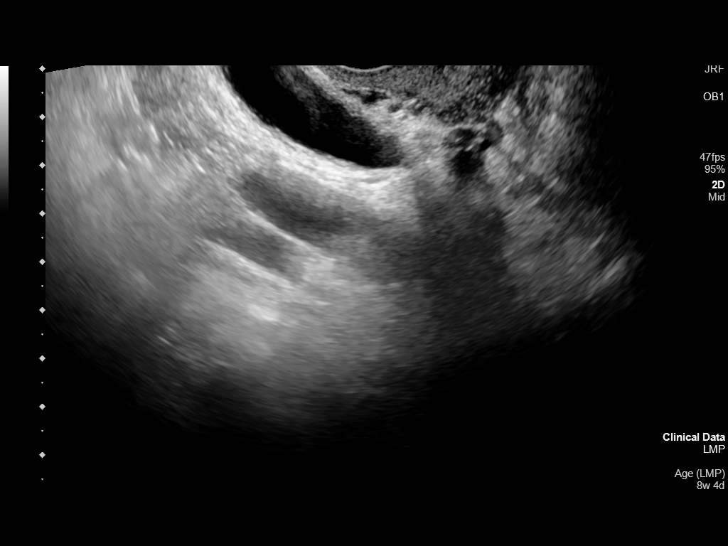
[im 83/87]
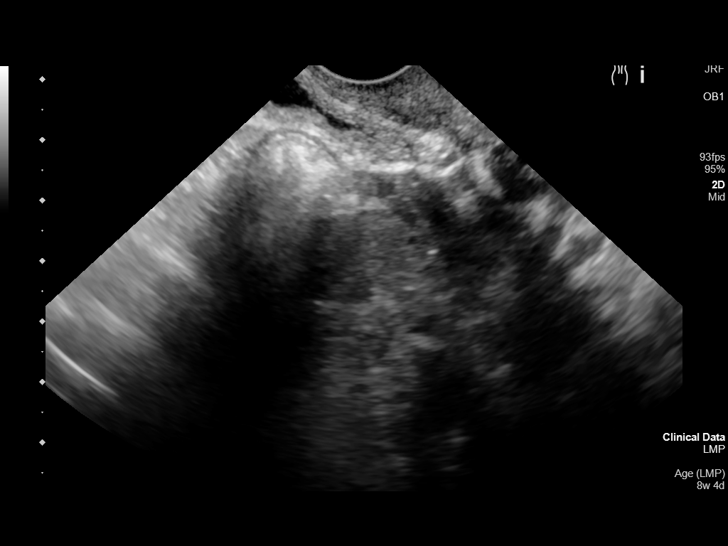

[13 of 28 positions shown; findings below may reference images not displayed]

FINDINGS: Intrauterine gestational sac: None visible today. There is a mildly
heterogeneous appearance of the endometrium in the lower uterine
segment on image 54. Bland endometrial thickening measuring up to 9
millimeters elsewhere.

Maternal uterus/adnexae:

The uterus measures 9.7 x 4.5 x 6.5 centimeters with a volume of 151
milliliters.

The right ovary measures 3.1 x 2.5 x 3.2 centimeters with a volume
of 13 milliliters. There is a 15 millimeter cyst with no internal
vascular elements and no significant surrounding hypervascularity
(image 73).

The left ovary is smaller measuring 2.0 x 1.5 x 1.6 centimeters with
a volume of 3 milliliters. Bland appearing left ovary.

No pelvic free fluid.
IMPRESSION: 1. Absence of the small intrauterine gestational sac seen on
01/18/2019, compatible with interval spontaneous abortion.
2. No pelvic free fluid. Negative appearance of the uterus and
ovaries today.
